# Patient Record
Sex: Female | Born: 1950
Health system: Southern US, Community
[De-identification: ages and names within clinical notes are randomized; demographics above are authoritative.]

## PROBLEM LIST (undated history)

## (undated) DIAGNOSIS — Z5189 Encounter for other specified aftercare: Secondary | ICD-10-CM

## (undated) DIAGNOSIS — R112 Nausea with vomiting, unspecified: Secondary | ICD-10-CM

## (undated) DIAGNOSIS — T7840XA Allergy, unspecified, initial encounter: Secondary | ICD-10-CM

## (undated) DIAGNOSIS — T8859XA Other complications of anesthesia, initial encounter: Secondary | ICD-10-CM

## (undated) DIAGNOSIS — Z86010 Personal history of colonic polyps: Secondary | ICD-10-CM

## (undated) DIAGNOSIS — T4145XA Adverse effect of unspecified anesthetic, initial encounter: Secondary | ICD-10-CM

## (undated) DIAGNOSIS — J302 Other seasonal allergic rhinitis: Secondary | ICD-10-CM

## (undated) DIAGNOSIS — M1990 Unspecified osteoarthritis, unspecified site: Secondary | ICD-10-CM

## (undated) DIAGNOSIS — Z9889 Other specified postprocedural states: Secondary | ICD-10-CM

## (undated) HISTORY — DX: Other seasonal allergic rhinitis: J30.2

## (undated) HISTORY — PX: POLYPECTOMY: SHX149

## (undated) HISTORY — PX: COLONOSCOPY: SHX174

## (undated) HISTORY — DX: Allergy, unspecified, initial encounter: T78.40XA

## (undated) HISTORY — DX: Encounter for other specified aftercare: Z51.89

## (undated) HISTORY — PX: REDUCTION MAMMAPLASTY: SUR839

## (undated) HISTORY — DX: Personal history of colonic polyps: Z86.010

---

## 1983-07-20 HISTORY — PX: SPINAL FUSION: SHX223

## 1996-07-19 HISTORY — PX: ABDOMINAL HYSTERECTOMY: SHX81

## 1997-11-15 ENCOUNTER — Other Ambulatory Visit: Admission: RE | Admit: 1997-11-15 | Discharge: 1997-11-15 | Payer: Self-pay | Admitting: Obstetrics and Gynecology

## 1998-11-21 ENCOUNTER — Other Ambulatory Visit: Admission: RE | Admit: 1998-11-21 | Discharge: 1998-11-21 | Payer: Self-pay | Admitting: Obstetrics and Gynecology

## 2000-01-04 ENCOUNTER — Other Ambulatory Visit: Admission: RE | Admit: 2000-01-04 | Discharge: 2000-01-04 | Payer: Self-pay | Admitting: Obstetrics and Gynecology

## 2000-12-09 ENCOUNTER — Encounter: Payer: Self-pay | Admitting: Obstetrics and Gynecology

## 2000-12-09 ENCOUNTER — Encounter: Admission: RE | Admit: 2000-12-09 | Discharge: 2000-12-09 | Payer: Self-pay | Admitting: Obstetrics and Gynecology

## 2001-01-23 ENCOUNTER — Other Ambulatory Visit: Admission: RE | Admit: 2001-01-23 | Discharge: 2001-01-23 | Payer: Self-pay | Admitting: Obstetrics and Gynecology

## 2001-12-12 ENCOUNTER — Encounter: Payer: Self-pay | Admitting: Obstetrics and Gynecology

## 2001-12-12 ENCOUNTER — Encounter: Admission: RE | Admit: 2001-12-12 | Discharge: 2001-12-12 | Payer: Self-pay | Admitting: Obstetrics and Gynecology

## 2003-01-30 ENCOUNTER — Encounter: Payer: Self-pay | Admitting: Obstetrics and Gynecology

## 2003-01-30 ENCOUNTER — Encounter: Admission: RE | Admit: 2003-01-30 | Discharge: 2003-01-30 | Payer: Self-pay | Admitting: Obstetrics and Gynecology

## 2003-04-19 ENCOUNTER — Other Ambulatory Visit: Admission: RE | Admit: 2003-04-19 | Discharge: 2003-04-19 | Payer: Self-pay | Admitting: Obstetrics and Gynecology

## 2003-07-20 HISTORY — PX: BREAST REDUCTION SURGERY: SHX8

## 2003-12-19 ENCOUNTER — Encounter: Admission: RE | Admit: 2003-12-19 | Discharge: 2003-12-19 | Payer: Self-pay | Admitting: Obstetrics and Gynecology

## 2005-02-02 ENCOUNTER — Encounter: Admission: RE | Admit: 2005-02-02 | Discharge: 2005-02-02 | Payer: Self-pay | Admitting: Obstetrics and Gynecology

## 2005-03-11 ENCOUNTER — Other Ambulatory Visit: Admission: RE | Admit: 2005-03-11 | Discharge: 2005-03-11 | Payer: Self-pay | Admitting: Obstetrics and Gynecology

## 2006-03-08 ENCOUNTER — Encounter: Admission: RE | Admit: 2006-03-08 | Discharge: 2006-03-08 | Payer: Self-pay | Admitting: Internal Medicine

## 2006-05-30 ENCOUNTER — Encounter: Admission: RE | Admit: 2006-05-30 | Discharge: 2006-05-30 | Payer: Self-pay | Admitting: Internal Medicine

## 2007-04-14 ENCOUNTER — Encounter: Admission: RE | Admit: 2007-04-14 | Discharge: 2007-04-14 | Payer: Self-pay | Admitting: Internal Medicine

## 2008-05-31 ENCOUNTER — Encounter: Admission: RE | Admit: 2008-05-31 | Discharge: 2008-05-31 | Payer: Self-pay | Admitting: Internal Medicine

## 2009-06-04 ENCOUNTER — Encounter: Admission: RE | Admit: 2009-06-04 | Discharge: 2009-06-04 | Payer: Self-pay | Admitting: Internal Medicine

## 2010-06-23 ENCOUNTER — Encounter: Admission: RE | Admit: 2010-06-23 | Discharge: 2010-06-23 | Payer: Self-pay | Admitting: Internal Medicine

## 2010-08-08 ENCOUNTER — Encounter: Payer: Self-pay | Admitting: Internal Medicine

## 2011-10-05 ENCOUNTER — Other Ambulatory Visit: Payer: Self-pay | Admitting: Internal Medicine

## 2011-10-05 DIAGNOSIS — Z1231 Encounter for screening mammogram for malignant neoplasm of breast: Secondary | ICD-10-CM

## 2011-10-26 ENCOUNTER — Ambulatory Visit: Payer: Self-pay

## 2011-11-05 ENCOUNTER — Ambulatory Visit
Admission: RE | Admit: 2011-11-05 | Discharge: 2011-11-05 | Disposition: A | Discharge status: Home / Self Care | Payer: BC Managed Care – PPO | Source: Ambulatory Visit | Attending: Internal Medicine | Admitting: Internal Medicine

## 2011-11-05 DIAGNOSIS — Z1231 Encounter for screening mammogram for malignant neoplasm of breast: Secondary | ICD-10-CM

## 2012-07-19 DIAGNOSIS — Z860100 Personal history of colon polyps, unspecified: Secondary | ICD-10-CM

## 2012-07-19 HISTORY — DX: Personal history of colon polyps, unspecified: Z86.0100

## 2012-07-19 HISTORY — DX: Personal history of colonic polyps: Z86.010

## 2012-11-30 ENCOUNTER — Other Ambulatory Visit: Payer: Self-pay

## 2012-11-30 DIAGNOSIS — Z1231 Encounter for screening mammogram for malignant neoplasm of breast: Secondary | ICD-10-CM

## 2012-12-20 ENCOUNTER — Ambulatory Visit
Admission: RE | Admit: 2012-12-20 | Discharge: 2012-12-20 | Disposition: A | Discharge status: Home / Self Care | Payer: BC Managed Care – PPO | Source: Ambulatory Visit

## 2012-12-20 DIAGNOSIS — Z1231 Encounter for screening mammogram for malignant neoplasm of breast: Secondary | ICD-10-CM

## 2013-02-20 ENCOUNTER — Encounter: Payer: Self-pay | Admitting: Internal Medicine

## 2013-05-30 ENCOUNTER — Ambulatory Visit (AMBULATORY_SURGERY_CENTER): Payer: Self-pay | Admitting: *Deleted

## 2013-05-30 VITALS — Ht 69.0 in | Wt 138.0 lb

## 2013-05-30 DIAGNOSIS — Z1211 Encounter for screening for malignant neoplasm of colon: Secondary | ICD-10-CM

## 2013-05-30 MED ORDER — MOVIPREP 100 G PO SOLR: ORAL | Status: DC

## 2013-05-30 NOTE — Progress Notes (Signed)
No allergies to eggs or soy. No problems with anesthesia.  

## 2013-05-31 ENCOUNTER — Encounter: Payer: Self-pay | Admitting: Internal Medicine

## 2013-06-13 ENCOUNTER — Encounter: Payer: Self-pay | Admitting: Internal Medicine

## 2013-06-13 ENCOUNTER — Ambulatory Visit (AMBULATORY_SURGERY_CENTER): Payer: BC Managed Care – PPO | Admitting: Internal Medicine

## 2013-06-13 VITALS — BP 117/62 | HR 53 | Temp 96.6°F | Resp 20 | Ht 69.0 in | Wt 138.0 lb

## 2013-06-13 DIAGNOSIS — Z1211 Encounter for screening for malignant neoplasm of colon: Secondary | ICD-10-CM

## 2013-06-13 DIAGNOSIS — D129 Benign neoplasm of anus and anal canal: Secondary | ICD-10-CM

## 2013-06-13 DIAGNOSIS — D128 Benign neoplasm of rectum: Secondary | ICD-10-CM

## 2013-06-13 DIAGNOSIS — D126 Benign neoplasm of colon, unspecified: Secondary | ICD-10-CM

## 2013-06-13 MED ORDER — SODIUM CHLORIDE 0.9 % IV SOLN: 500.0000 mL | INTRAVENOUS | Status: DC

## 2013-06-13 NOTE — Progress Notes (Signed)
Patient did not experience any of the following events: a burn prior to discharge; a fall within the facility; wrong site/side/patient/procedure/implant event; or a hospital transfer or hospital admission upon discharge from the facility. (G8907) Patient did not have preoperative order for IV antibiotic SSI prophylaxis. (G8918)  

## 2013-06-13 NOTE — Patient Instructions (Addendum)

## 2013-06-13 NOTE — Progress Notes (Signed)
Report to pacu rn, vss, bbs=clear 

## 2013-06-13 NOTE — Progress Notes (Signed)
Called to room to assist during endoscopic procedure.  Patient ID and intended procedure confirmed with present staff. Received instructions for my participation in the procedure from the performing physician.  

## 2013-06-13 NOTE — Op Note (Signed)
[facility] [address] [place], [address]   COLONOSCOPY PROCEDURE REPORT  PATIENT: Kaitlyn, Spencer  MR#: [id number] BIRTHDATE: January 18, 1951 , 62  yrs. old GENDER: Female ENDOSCOPIST: Hart Carwin, MD REFERRED XB:[id number] Avva, M.D. PROCEDURE DATE:  06/13/2013 PROCEDURE:   Colonoscopy with snare polypectomy and Colonoscopy with cold biopsy polypectomy First Screening Colonoscopy - Avg.  risk and is 50 yrs.  old or older - No.  Prior Negative Screening - Now for repeat screening. 10 or more years since last screening  History of Adenoma - Now for follow-up colonoscopy & has been > or = to 3 yrs.  N/A  Polyps Removed Today? Yes. ASA CLASS:   Class I INDICATIONS:Average risk patient for colon cancer. MEDICATIONS: MAC sedation, administered by CRNA and Propofol (Diprivan) 320 mg IV  DESCRIPTION OF PROCEDURE:   After the risks benefits and alternatives of the procedure were thoroughly explained, informed consent was obtained.  A digital rectal exam revealed no abnormalities of the rectum.   The LB PFC-H190 [id number]  endoscope was introduced through the anus and advanced to the cecum, which was identified by both the appendix and ileocecal valve. No adverse events experienced.   The quality of the prep was excellent, using MoviPrep  The instrument was then slowly withdrawn as the colon was fully examined.      COLON FINDINGS: Multiple smooth sessile polyps ranging between 3-22mm in size were found in the sigmoid colon and rectum.  A polypectomy was performed with cold forceps and using snare cautery.  The resection was complete and the polyp tissue was completely retrieved.   Mild diverticulosis was noted in the sigmoid colon. Retroflexed views revealed no abnormalities. The time to cecum=3 minutes 45 seconds.  Withdrawal time=15 minutes 55 seconds.  The scope was withdrawn and the procedure completed. COMPLICATIONS: There were no complications.  ENDOSCOPIC  IMPRESSION: 1.   Multiple sessile polyps ranging between 3-57mm in size were found in the sigmoid colon and rectum; polypectomy was performed with cold forceps and using snare cautery 2.   Mild diverticulosis was noted in the sigmoid colon  RECOMMENDATIONS: 1.  Await pathology results 2.  High fiber diet 3.   recall colonoscopy pending biopsies   eSigned:  Hart Carwin, MD 06/13/2013 8:37 AM   cc:   PATIENT NAME:  Kaitlyn, Spencer MR#: [id number]

## 2013-06-18 ENCOUNTER — Telehealth: Payer: Self-pay | Admitting: *Deleted

## 2013-06-18 NOTE — Telephone Encounter (Signed)
  Follow up Call-  Call back number 06/13/2013  Post procedure Call Back phone  # [phone]  Permission to leave phone message Yes     Patient questions:  Do you have a fever, pain , or abdominal swelling? no Pain Score  0 *  Have you tolerated food without any problems? yes  Have you been able to return to your normal activities? yes  Do you have any questions about your discharge instructions: Diet   no Medications  no Follow up visit  no  Do you have questions or concerns about your Care? no  Actions: * If pain score is 4 or above: No action needed, pain <4.

## 2013-06-19 ENCOUNTER — Encounter: Payer: Self-pay | Admitting: Internal Medicine

## 2013-07-25 ENCOUNTER — Encounter: Payer: Self-pay | Admitting: Internal Medicine

## 2014-06-18 ENCOUNTER — Ambulatory Visit
Admission: RE | Admit: 2014-06-18 | Discharge: 2014-06-18 | Disposition: A | Discharge status: Home / Self Care | Payer: BC Managed Care – PPO | Source: Ambulatory Visit | Attending: Internal Medicine | Admitting: Internal Medicine

## 2014-06-18 ENCOUNTER — Other Ambulatory Visit: Payer: Self-pay | Admitting: Internal Medicine

## 2014-06-18 DIAGNOSIS — N6325 Unspecified lump in the left breast, overlapping quadrants: Secondary | ICD-10-CM

## 2014-06-18 DIAGNOSIS — N6320 Unspecified lump in the left breast, unspecified quadrant: Principal | ICD-10-CM

## 2014-09-24 ENCOUNTER — Encounter: Payer: Self-pay | Admitting: *Deleted

## 2014-09-25 ENCOUNTER — Ambulatory Visit (INDEPENDENT_AMBULATORY_CARE_PROVIDER_SITE_OTHER): Payer: 59 | Admitting: *Deleted

## 2014-09-25 DIAGNOSIS — I8393 Asymptomatic varicose veins of bilateral lower extremities: Secondary | ICD-10-CM

## 2014-09-25 DIAGNOSIS — I781 Nevus, non-neoplastic: Secondary | ICD-10-CM

## 2014-09-25 NOTE — Progress Notes (Signed)
   Cutaneous Laser:pulsed mode  810j/cm2 400 ms delay  13 ms Duration 0.5 spot  Total pulses: 1489 Total energy 2.359  Total time::19  Photos: No.  Compression stockings applied: No.NA  Tiny red spiders (capillaries) on her legs. Too small for sclero. Great response to CL. Tol well. Anticipate good results. Follow prn.

## 2014-11-29 ENCOUNTER — Ambulatory Visit: Payer: 59 | Admitting: Podiatry

## 2014-12-25 ENCOUNTER — Ambulatory Visit: Payer: 59 | Admitting: Podiatry

## 2015-02-17 ENCOUNTER — Ambulatory Visit (INDEPENDENT_AMBULATORY_CARE_PROVIDER_SITE_OTHER): Payer: No Typology Code available for payment source | Admitting: Podiatry

## 2015-02-17 ENCOUNTER — Ambulatory Visit (INDEPENDENT_AMBULATORY_CARE_PROVIDER_SITE_OTHER): Payer: No Typology Code available for payment source

## 2015-02-17 ENCOUNTER — Encounter: Payer: Self-pay | Admitting: Podiatry

## 2015-02-17 VITALS — BP 99/86 | HR 69 | Resp 12

## 2015-02-17 DIAGNOSIS — M792 Neuralgia and neuritis, unspecified: Secondary | ICD-10-CM | POA: Diagnosis not present

## 2015-02-17 DIAGNOSIS — D3610 Benign neoplasm of peripheral nerves and autonomic nervous system, unspecified: Secondary | ICD-10-CM

## 2015-02-17 DIAGNOSIS — R52 Pain, unspecified: Secondary | ICD-10-CM

## 2015-02-17 NOTE — Progress Notes (Signed)
   Subjective:    Patient ID: Kaitlyn Spencer, female    DOB: January 11, 1951, 64 y.o.   MRN: [id number]  HPI  64 year old female presents the office with concerns of right second, third, fourth toe numbness which has been ongoing for approximately 6 months. She states that she only has numbness after working out for proximal 45 min to 1 hour before the sensations start to occur. She has no pain at rest or any tingling or numbness at rest or with regular activity. She states that she only has assumed tried bike or wearing a heavy hiking shoe. She denies any history of injury or trauma. Denies any swelling or redness. She's had no prior treatment. No other complaints at this time.   Review of Systems  Neurological: Positive for numbness.  All other systems reviewed and are negative.      Objective:   Physical Exam \\AAO  x3, NAD DP/PT pulses palpable bilaterally, CRT less than 3 seconds Protective sensation intact with Simms Weinstein monofilament, vibratory sensation intact, Achilles tendon reflex intact There is mild to palpation along the second interspace and the right foot. No palpable neuromas identified and there is no pain with medial to lateral compression of the metatarsals. No areas of pinpoint tenderness to bilateral lower extremities. No other areas of tenderness to bilateral lower extremities. MMT 5/5, ROM WNL. Mild hammertoe contractures. Upon weightbearing there is slight splaying of the second and third toes bilaterally. No open lesions or pre-ulcerative lesions.  No overlying edema, erythema, increase in warmth to bilateral lower extremities.  No pain with calf compression, swelling, warmth, erythema bilaterally.      Assessment & Plan:  64 year old female right foot toe numbness after activity, possible neuroma versus biomechanical in nature -X-rays were obtained and reviewed with the patient.  -Treatment options discussed including all alternatives, risks, and  complications -Discussed likely etiology of her symptoms. -Discussed orthotics and offloading. I dispensed her neuroma pad as well as metatarsal pads to try and her shoes. At next appointment we'll see how she is doing and likely make this into a more custom orthotic if this helps.  -Follow-up 4-6 weeks or sooner if any problems arise. In the meantime, encouraged to call the office with any questions, concerns, change in symptoms.   Celesta Gentile, DPM

## 2015-03-31 ENCOUNTER — Ambulatory Visit: Payer: Self-pay | Admitting: Podiatry

## 2015-06-06 ENCOUNTER — Other Ambulatory Visit: Payer: Self-pay

## 2015-06-06 DIAGNOSIS — Z1231 Encounter for screening mammogram for malignant neoplasm of breast: Secondary | ICD-10-CM

## 2015-06-09 ENCOUNTER — Encounter: Payer: Self-pay | Admitting: *Deleted

## 2015-06-11 ENCOUNTER — Ambulatory Visit (INDEPENDENT_AMBULATORY_CARE_PROVIDER_SITE_OTHER): Payer: No Typology Code available for payment source | Admitting: *Deleted

## 2015-06-11 DIAGNOSIS — I781 Nevus, non-neoplastic: Secondary | ICD-10-CM

## 2015-06-11 DIAGNOSIS — I8393 Asymptomatic varicose veins of bilateral lower extremities: Secondary | ICD-10-CM

## 2015-06-11 NOTE — Progress Notes (Signed)
Pt very pleased with the reults from her first treatment. Cleaned up any capillaries I saw. Tol well. Follow this nice lady prn.  Cutaneous Laser:pulsed mode  810j/cm2 400 ms delay  13 ms Duration 0.5 spot  Total pulses: 984 Total energy 1.561.  Total time::06  Photos: No.  Compression stockings applied: No.

## 2015-06-16 ENCOUNTER — Encounter: Payer: Self-pay | Admitting: *Deleted

## 2015-07-08 ENCOUNTER — Ambulatory Visit
Admission: RE | Admit: 2015-07-08 | Discharge: 2015-07-08 | Disposition: A | Discharge status: Home / Self Care | Payer: No Typology Code available for payment source | Source: Ambulatory Visit

## 2015-07-08 DIAGNOSIS — Z1231 Encounter for screening mammogram for malignant neoplasm of breast: Secondary | ICD-10-CM

## 2016-04-16 ENCOUNTER — Other Ambulatory Visit: Payer: Self-pay | Admitting: Otolaryngology

## 2016-04-16 DIAGNOSIS — J309 Allergic rhinitis, unspecified: Secondary | ICD-10-CM

## 2016-05-04 ENCOUNTER — Ambulatory Visit
Admission: RE | Admit: 2016-05-04 | Discharge: 2016-05-04 | Disposition: A | Discharge status: Home / Self Care | Payer: BLUE CROSS/BLUE SHIELD | Source: Ambulatory Visit | Attending: Otolaryngology | Admitting: Otolaryngology

## 2016-05-04 DIAGNOSIS — J309 Allergic rhinitis, unspecified: Secondary | ICD-10-CM

## 2016-05-04 MED ORDER — IOPAMIDOL (ISOVUE-300) INJECTION 61%
75.0000 mL | Freq: Once | INTRAVENOUS | Status: AC | PRN
  Administered 2016-05-04: 75 mL via INTRAVENOUS

## 2016-06-03 ENCOUNTER — Encounter (INDEPENDENT_AMBULATORY_CARE_PROVIDER_SITE_OTHER): Payer: Self-pay

## 2016-06-03 ENCOUNTER — Encounter: Payer: Self-pay | Admitting: Allergy & Immunology

## 2016-06-03 ENCOUNTER — Ambulatory Visit (INDEPENDENT_AMBULATORY_CARE_PROVIDER_SITE_OTHER): Payer: BLUE CROSS/BLUE SHIELD | Admitting: Allergy & Immunology

## 2016-06-03 VITALS — BP 122/70 | HR 63 | Temp 97.4°F | Ht 67.0 in | Wt 137.6 lb

## 2016-06-03 DIAGNOSIS — J329 Chronic sinusitis, unspecified: Secondary | ICD-10-CM | POA: Diagnosis not present

## 2016-06-03 DIAGNOSIS — J3089 Other allergic rhinitis: Secondary | ICD-10-CM

## 2016-06-03 NOTE — Progress Notes (Signed)
NEW PATIENT  Date of Service/Encounter:  06/03/16   Assessment:   Chronic nonseasonal allergic rhinitis due to fungal spores - grasses, ragweed, pigweed, maple, molds, dog, and dust mite  Recurrent sinusitis    Plan/Recommendations:   1. Recurrent sinusitis with chronic allergic rhinitis - Testing showed: grasses, ragweed, pigweed, maple, molds, dog, and dust mite - Continue with your extensive nasal regimen.  - Stop Flonase and start Dymista 2 sprays 1-2 times daily.  - Check with your insurance company and call us with a confirmation that you definitely want to start allergy shots. - We will get the paperwork ready to discuss the process with the outside clinic near your home. - She would like to get her shots at a local clinic [facility] located at [address], [place] [address]; phone number [phone]).  - Consent signed today.   2. Return in about 6 months (around 12/01/2016).   Subjective:   Kaitlyn Spencer is a 65 y.o. female presenting today for evaluation of  Chief Complaint  Patient presents with  . New Evaluation    allergy testing.   Kaitlyn Spencer has a history of the following: Patient Active Problem List   Diagnosis Date Noted  . Chronic nonseasonal allergic rhinitis due to fungal spores 06/03/2016    History obtained from: chart review and patient.  Kaitlyn Spencer was referred by Kaitlyn Ringer, MD.     Kaitlyn Spencer is a 65 y.o. female presenting for an allergy evaluation. She has a long-standing history of allergic rhinitis. She was tested nearly 2 decades ago. She worked with Dr. Smitty Pluck 10-15 years ago and had allergy shots at that time. She was on them for probably 1-3 years, but felt that they lost her effect over time. 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 She reports that her sinus infections have become more severe and more frequent. She estimates that she has 3 sinus infections per year  that require both antibiotics and redness 7 for complete clearance. Her symptoms start with nasal congestion and then gradually worsened to include headaches. She has a very extensive nasal regimen including saline rinses with a Nasal Douche. She does this twice daily. She also uses Flonase 2 sprays per nostril twice daily and Claritin 10 mg daily.     [name] sinus symptoms occur throughout the year. Traveling seems to make it worse, otherwise there are no exacerbating factors. She does note worsening symptoms in the fall when she is picking apples. She has no history of asthma. She does have a history of an amoxicillin allergy resulting in hives. At the time that this was diagnosed, she also had "oils or nails and toenails as well as the side of her tongue. She does get stomach pain from aspirin. Otherwise, there is no history of other atopic diseases, including asthma, food allergies, stinging insect allergies, or urticaria. There is no significant infectious history. Vaccinations are up to date.    Past Medical History: Patient Active Problem List   Diagnosis Date Noted  . Chronic nonseasonal allergic rhinitis due to fungal spores 06/03/2016    Medication List:    Medication List       Accurate as of 06/03/16  3:57 PM. Always use your most recent med list.          CALCIUM 600 600 MG Tabs tablet Generic drug:  calcium carbonate Take 600 mg by mouth.   estradiol  0.5 MG tablet Commonly known as:  ESTRACE Take 0.5 mg by mouth daily.   loratadine 10 MG tablet Commonly known as:  CLARITIN Take 10 mg by mouth daily.   Multiple Vitamins tablet Take 1 tablet by mouth.   VAGIFEM 10 MCG Tabs vaginal tablet Generic drug:  Estradiol   VITAMIN D-1000 MAX ST 1000 units tablet Generic drug:  Cholecalciferol Take 1,000 Units by mouth.       Birth History: non-contributory. Born at term without complications.   Developmental History: Kaitlyn Spencer has met all milestones on time. She  has required no speech therapy, occupational therapy, or physical therapy.   Past Surgical History: Past Surgical History:  Procedure Laterality Date  . ABDOMINAL HYSTERECTOMY  1998  . SPINAL FUSION  1985     Family History: Family History  Problem Relation Age of Onset  . Allergic rhinitis Father   . Colon cancer Neg Hx   . Asthma Neg Hx   . Atopy Neg Hx   . Eczema Neg Hx   . Immunodeficiency Neg Hx   . Urticaria Neg Hx      Social History: Kaitlyn Spencer lives at home with her husband. Her husband actually works in [employer] and lives in an apartment in [place] 3 days per week. This is 75 marriage for each of them. Her husband does have some adult grown children. She lives in a house that is 59 years old. There is hardwood in the living areas and carpeting in the bedrooms. They have electric heating and central cooling. There are cats inside the home. They have dust mite covers on the pillows, but not the bed. There is no tobacco smoke exposure. 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 She does work very hard and or tears. They have 250 acres. Fall seems to be about time for her as she is picking apples at that time and is exposed to multiple pollens and allergens.   Review of Systems: a 14-point review of systems is pertinent for what is mentioned in HPI.  Otherwise, all other systems were negative. Constitutional: negative other than that listed in the HPI Eyes: negative other than that listed in the HPI Ears, nose, mouth, throat, and face: negative other than that listed in the HPI Respiratory: negative other than that listed in the HPI Cardiovascular: negative other than that listed in the HPI Gastrointestinal: negative other than that listed in the HPI Genitourinary: negative other than that listed in the HPI Integument: negative other than that listed in the HPI Hematologic: negative other than that  listed in the HPI Musculoskeletal: negative other than that listed in the HPI Neurological: negative other than that listed in the HPI Allergy/Immunologic: negative other than that listed in the HPI    Objective:   Blood pressure 122/70, pulse 63, temperature 97.4 F (36.3 C), temperature source Oral, height 5\' 7"  (1.702 m), weight 137 lb 9.6 oz (62.4 kg), SpO2 98 %. Body mass index is 21.55 kg/m.   Physical Exam:  General: Alert, interactive, in no acute distress. Pleasant and talkative female. HEENT: TMs pearly gray, turbinates edematous and pale with clear discharge, post-pharynx erythematous. Cobblestoning present in the posterior oropharynx. Neck: Supple without thyromegaly. Adenopathy: no enlarged lymph nodes appreciated in the anterior cervical, occipital, axillary, epitrochlear, inguinal, or popliteal regions Lungs: Clear to auscultation without wheezing, rhonchi or rales. No increased work of breathing. CV: Physiologic splitting of S1/S2, no murmurs. Capillary  refill <2 seconds.  Abdomen: Nondistended, nontender. No guarding or rebound tenderness. Bowel sounds faint and present in all fields  Skin: Warm and dry, without lesions or rashes. Extremities:  No clubbing, cyanosis or edema. Neuro:   Grossly intact. Responsive to questions.  Diagnostic studies:   Allergy Studies:   Indoor/Outdoor Percutaneous Adult Environmental Panel: Positive to [place], perennial rye grass, ragweed, rough pigweed, and maple with adequate controls  Indoor/Outdoor Selected Intradermal Environmental Panel: Positive to mold mixes 2, mold mix 4, dog, and dust mite with adequate controls      Salvatore Marvel, MD [facility] [place] [facility] [place]

## 2016-06-03 NOTE — Patient Instructions (Addendum)
1. Chronic sinusitis, unspecified location - Testing showed: grasses, ragweed, pigweed, maple, molds, dog, and dust mite - Continue with your extensive nasal regimen.  - Stop Flonase and start Dymista 2 sprays 1-2 times daily.  - Check with your insurance company and call us with a confirmation that you definitely want to start allergy shots. - We will get the paperwork ready to discuss the process with the outside clinic near your home.  2. Return in about 6 months (around 12/01/2016).  Please inform us of any Emergency Department visits, hospitalizations, or changes in symptoms. Call us before going to the ED for breathing or allergy symptoms since we might be able to fit you in for a sick visit. Feel free to contact us anytime with any questions, problems, or concerns.  It was a pleasure to meet you today!   Websites that have reliable patient information: 1. American Academy of Asthma, Allergy, and Immunology: www.aaaai.org 2. Food Allergy Research and Education (FARE): foodallergy.org 3. Mothers of Asthmatics: http://www.asthmacommunitynetwork.org 4. American College of Allergy, Asthma, and Immunology: www.acaai.org

## 2016-07-05 ENCOUNTER — Other Ambulatory Visit: Payer: Self-pay | Admitting: Internal Medicine

## 2016-07-05 DIAGNOSIS — Z1231 Encounter for screening mammogram for malignant neoplasm of breast: Secondary | ICD-10-CM

## 2016-07-21 NOTE — Progress Notes (Signed)
Vials made.  JM

## 2016-07-22 DIAGNOSIS — J3089 Other allergic rhinitis: Secondary | ICD-10-CM | POA: Diagnosis not present

## 2016-07-23 DIAGNOSIS — J301 Allergic rhinitis due to pollen: Secondary | ICD-10-CM | POA: Diagnosis not present

## 2016-07-28 ENCOUNTER — Ambulatory Visit
Admission: RE | Admit: 2016-07-28 | Discharge: 2016-07-28 | Disposition: A | Discharge status: Home / Self Care | Payer: BLUE CROSS/BLUE SHIELD | Source: Ambulatory Visit | Attending: Internal Medicine | Admitting: Internal Medicine

## 2016-07-28 ENCOUNTER — Ambulatory Visit: Payer: Self-pay

## 2016-07-28 ENCOUNTER — Ambulatory Visit (INDEPENDENT_AMBULATORY_CARE_PROVIDER_SITE_OTHER): Payer: BLUE CROSS/BLUE SHIELD

## 2016-07-28 ENCOUNTER — Telehealth: Payer: Self-pay

## 2016-07-28 DIAGNOSIS — J3089 Other allergic rhinitis: Secondary | ICD-10-CM

## 2016-07-28 DIAGNOSIS — Z1231 Encounter for screening mammogram for malignant neoplasm of breast: Secondary | ICD-10-CM

## 2016-07-28 NOTE — Telephone Encounter (Addendum)
[facility] clld back- advsd she spoke to the pt. She will be scheduling appt next week with Butch Penny, NP to re-establish with their office as well as receive her injections there. I Skeet Simmer once I receive the signed transfer form I will forward the pt.'s vials. Lovena Le stated she understood .     Received call from [place] will have to re-establish with the clinic before any injections can be done. Per Lovena Le, pt has not been seen in their office since 2013. Lovena Le will contact pt to schedule appt. Faxed 06/03/16 office notes - will wait for callback regarding appt and signed transfer letter from their office.

## 2016-10-01 ENCOUNTER — Ambulatory Visit (INDEPENDENT_AMBULATORY_CARE_PROVIDER_SITE_OTHER): Payer: BLUE CROSS/BLUE SHIELD | Admitting: *Deleted

## 2016-10-01 DIAGNOSIS — J309 Allergic rhinitis, unspecified: Secondary | ICD-10-CM

## 2016-10-01 NOTE — Progress Notes (Signed)
Immunotherapy   Patient Details  Name: Kaitlyn Spencer MRN: [id number] Date of Birth: 21-Jun-1951  10/01/2016  [name] here to pick up  Gold vial (Mold-Mite & Pollen-Dog). Following schedule: B  Frequency:2 times per week Epi-Pen:Epi-Pen Available  Consent signed and patient instructions given. No problems after 30 minutes in the office.    Constance Holster 10/01/2016, 9:32 AM

## 2016-11-30 ENCOUNTER — Encounter: Payer: Self-pay | Admitting: *Deleted

## 2016-11-30 NOTE — Progress Notes (Signed)
Immunotherapy   Patient Details  Name: Kaitlyn Spencer MRN: [id number] Date of Birth: 08-30-1950  11/30/2016  [name] mailed Green vials 1:1000 for  Pollen-Dog & Mold-Mite. Following schedule: B  Frequency:2 times per week Epi-Pen:Epi-Pen Available  Consent signed and patient instructions given. Patient receives [facility] at [address], [place] [address].    Constance Holster 11/30/2016, 2:29 PM

## 2016-12-21 ENCOUNTER — Ambulatory Visit: Payer: Self-pay

## 2016-12-21 ENCOUNTER — Encounter: Payer: Self-pay | Admitting: *Deleted

## 2016-12-21 NOTE — Progress Notes (Signed)
Immunotherapy   Patient Details  Name: Kaitlyn Spencer MRN: [id number] Date of Birth: [birth year]  12/21/2016  [name] mailed Red vials 1:100 for  Pollen-Dog & Mold-Mite. Following schedule: B  Frequency:1 times per week Epi-Pen:Epi-Pen Available  Consent signed and patient instructions given. Patient receives [facility] at [address], [place] [address].     Constance Holster 12/21/2016, 5:35 PM

## 2017-03-11 ENCOUNTER — Other Ambulatory Visit: Payer: Self-pay | Admitting: Internal Medicine

## 2017-03-11 DIAGNOSIS — L821 Other seborrheic keratosis: Secondary | ICD-10-CM | POA: Diagnosis not present

## 2017-03-11 DIAGNOSIS — L720 Epidermal cyst: Secondary | ICD-10-CM | POA: Diagnosis not present

## 2017-03-11 DIAGNOSIS — R197 Diarrhea, unspecified: Secondary | ICD-10-CM | POA: Diagnosis not present

## 2017-03-11 DIAGNOSIS — Z6820 Body mass index (BMI) 20.0-20.9, adult: Secondary | ICD-10-CM | POA: Diagnosis not present

## 2017-03-11 DIAGNOSIS — J301 Allergic rhinitis due to pollen: Secondary | ICD-10-CM | POA: Diagnosis not present

## 2017-03-11 DIAGNOSIS — K2900 Acute gastritis without bleeding: Secondary | ICD-10-CM

## 2017-03-11 DIAGNOSIS — D225 Melanocytic nevi of trunk: Secondary | ICD-10-CM | POA: Diagnosis not present

## 2017-03-11 DIAGNOSIS — D1801 Hemangioma of skin and subcutaneous tissue: Secondary | ICD-10-CM | POA: Diagnosis not present

## 2017-03-15 ENCOUNTER — Encounter: Payer: Self-pay | Admitting: *Deleted

## 2017-03-18 ENCOUNTER — Ambulatory Visit
Admission: RE | Admit: 2017-03-18 | Discharge: 2017-03-18 | Disposition: A | Discharge status: Home / Self Care | Payer: Medicare Other | Source: Ambulatory Visit | Attending: Internal Medicine | Admitting: Internal Medicine

## 2017-03-18 DIAGNOSIS — K2900 Acute gastritis without bleeding: Secondary | ICD-10-CM

## 2017-03-18 DIAGNOSIS — R1013 Epigastric pain: Secondary | ICD-10-CM | POA: Diagnosis not present

## 2017-03-18 NOTE — Progress Notes (Signed)
Immunotherapy   Patient Details  Name: Kaitlyn Spencer MRN: [id number] Date of Birth: [birth year]  03/15/2017  [name] mailed  Red vials Pollen-Dog & Mold-Mite. Following schedule: B  Frequency:1 time per week Epi-Pen:Epi-Pen Available  Consent signed and patient instructions given. Patient receives injections at [facility] @ [address], [place] [address]   Heather Clark 03/15/2017, 9:41 AM

## 2017-05-12 ENCOUNTER — Encounter (HOSPITAL_COMMUNITY)
Admission: EM | Disposition: A | Discharge status: Home Health | Payer: Self-pay | Source: Home / Self Care | Attending: Orthopedic Surgery

## 2017-05-12 ENCOUNTER — Encounter (HOSPITAL_COMMUNITY): Payer: Self-pay

## 2017-05-12 ENCOUNTER — Inpatient Hospital Stay (HOSPITAL_COMMUNITY): Payer: Medicare Other | Admitting: Certified Registered"

## 2017-05-12 ENCOUNTER — Inpatient Hospital Stay (HOSPITAL_COMMUNITY): Payer: Medicare Other

## 2017-05-12 ENCOUNTER — Inpatient Hospital Stay (HOSPITAL_COMMUNITY)
Admission: EM | Admit: 2017-05-12 | Discharge: 2017-05-13 | DRG: 481 | Disposition: A | Discharge status: Home Health | Payer: Medicare Other | Attending: Orthopedic Surgery | Admitting: Orthopedic Surgery

## 2017-05-12 DIAGNOSIS — D62 Acute posthemorrhagic anemia: Secondary | ICD-10-CM | POA: Diagnosis not present

## 2017-05-12 DIAGNOSIS — Z8262 Family history of osteoporosis: Secondary | ICD-10-CM | POA: Diagnosis not present

## 2017-05-12 DIAGNOSIS — Z880 Allergy status to penicillin: Secondary | ICD-10-CM | POA: Diagnosis not present

## 2017-05-12 DIAGNOSIS — Z01818 Encounter for other preprocedural examination: Secondary | ICD-10-CM

## 2017-05-12 DIAGNOSIS — Z419 Encounter for procedure for purposes other than remedying health state, unspecified: Secondary | ICD-10-CM

## 2017-05-12 DIAGNOSIS — Z820 Family history of epilepsy and other diseases of the nervous system: Secondary | ICD-10-CM | POA: Diagnosis not present

## 2017-05-12 DIAGNOSIS — Z886 Allergy status to analgesic agent status: Secondary | ICD-10-CM

## 2017-05-12 DIAGNOSIS — M898X9 Other specified disorders of bone, unspecified site: Secondary | ICD-10-CM | POA: Diagnosis present

## 2017-05-12 DIAGNOSIS — Z90710 Acquired absence of both cervix and uterus: Secondary | ICD-10-CM

## 2017-05-12 DIAGNOSIS — S7291XA Unspecified fracture of right femur, initial encounter for closed fracture: Secondary | ICD-10-CM | POA: Diagnosis not present

## 2017-05-12 DIAGNOSIS — Z881 Allergy status to other antibiotic agents status: Secondary | ICD-10-CM | POA: Diagnosis not present

## 2017-05-12 DIAGNOSIS — Z86010 Personal history of colonic polyps: Secondary | ICD-10-CM | POA: Diagnosis not present

## 2017-05-12 DIAGNOSIS — Z981 Arthrodesis status: Secondary | ICD-10-CM | POA: Diagnosis not present

## 2017-05-12 DIAGNOSIS — J3089 Other allergic rhinitis: Secondary | ICD-10-CM | POA: Diagnosis not present

## 2017-05-12 DIAGNOSIS — R9431 Abnormal electrocardiogram [ECG] [EKG]: Secondary | ICD-10-CM | POA: Diagnosis not present

## 2017-05-12 DIAGNOSIS — S72001A Fracture of unspecified part of neck of right femur, initial encounter for closed fracture: Secondary | ICD-10-CM | POA: Diagnosis not present

## 2017-05-12 DIAGNOSIS — S72009A Fracture of unspecified part of neck of unspecified femur, initial encounter for closed fracture: Secondary | ICD-10-CM | POA: Diagnosis present

## 2017-05-12 HISTORY — PX: HIP PINNING,CANNULATED: SHX1758

## 2017-05-12 LAB — CBC
HCT: 39.3 % (ref 36.0–46.0)
HCT: 39 % (ref 36.0–46.0)
Hemoglobin: 12.9 g/dL (ref 12.0–15.0)
Hemoglobin: 12.8 g/dL (ref 12.0–15.0)
MCH: 30.1 pg (ref 26.0–34.0)
MCH: 30.3 pg (ref 26.0–34.0)
MCHC: 32.8 g/dL (ref 30.0–36.0)
MCHC: 32.8 g/dL (ref 30.0–36.0)
MCV: 91.6 fL (ref 78.0–100.0)
MCV: 92.4 fL (ref 78.0–100.0)
PLATELETS: 211 10*3/uL (ref 150–400)
PLATELETS: 206 10*3/uL (ref 150–400)
RBC: 4.29 MIL/uL (ref 3.87–5.11)
RBC: 4.22 MIL/uL (ref 3.87–5.11)
RDW: 13.6 % (ref 11.5–15.5)
RDW: 13.2 % (ref 11.5–15.5)
WBC: 7.8 10*3/uL (ref 4.0–10.5)
WBC: 9 10*3/uL (ref 4.0–10.5)

## 2017-05-12 LAB — COMPREHENSIVE METABOLIC PANEL
ALT: 17 U/L (ref 14–54)
AST: 20 U/L (ref 15–41)
Albumin: 3.8 g/dL (ref 3.5–5.0)
Alkaline Phosphatase: 50 U/L (ref 38–126)
Anion gap: 8 (ref 5–15)
BUN: 9 mg/dL (ref 6–20)
CHLORIDE: 107 mmol/L (ref 101–111)
CO2: 23 mmol/L (ref 22–32)
CREATININE: 0.66 mg/dL (ref 0.44–1.00)
Calcium: 9 mg/dL (ref 8.9–10.3)
GFR calc Af Amer: >60 mL/min (ref >60)
GFR calc non Af Amer: >60 mL/min (ref >60)
Glucose, Bld: 101 mg/dL — ABNORMAL HIGH (ref 65–99)
Potassium: 3.7 mmol/L (ref 3.5–5.1)
SODIUM: 138 mmol/L (ref 135–145)
Total Bilirubin: 0.8 mg/dL (ref 0.3–1.2)
Total Protein: 6.2 g/dL — ABNORMAL LOW (ref 6.5–8.1)

## 2017-05-12 LAB — PHOSPHORUS: Phosphorus: 3.8 mg/dL (ref 2.5–4.6)

## 2017-05-12 LAB — MAGNESIUM: MAGNESIUM: 2 mg/dL (ref 1.7–2.4)

## 2017-05-12 LAB — CBC WITH DIFFERENTIAL/PLATELET
Basophils Absolute: 0 10*3/uL (ref 0.0–0.1)
Basophils Relative: 0 %
EOS ABS: 0 10*3/uL (ref 0.0–0.7)
Eosinophils Relative: 0 %
HEMATOCRIT: 41.2 % (ref 36.0–46.0)
HEMOGLOBIN: 13.8 g/dL (ref 12.0–15.0)
LYMPHS ABS: 2 10*3/uL (ref 0.7–4.0)
LYMPHS PCT: 24 %
MCH: 30.8 pg (ref 26.0–34.0)
MCHC: 33.5 g/dL (ref 30.0–36.0)
MCV: 92 fL (ref 78.0–100.0)
Monocytes Absolute: 0.7 10*3/uL (ref 0.1–1.0)
Monocytes Relative: 9 %
NEUTROS ABS: 5.5 10*3/uL (ref 1.7–7.7)
Neutrophils Relative %: 67 %
Platelets: 225 10*3/uL (ref 150–400)
RBC: 4.48 MIL/uL (ref 3.87–5.11)
RDW: 13 % (ref 11.5–15.5)
WBC: 8.3 10*3/uL (ref 4.0–10.5)

## 2017-05-12 LAB — BASIC METABOLIC PANEL
Anion gap: 7 (ref 5–15)
BUN: 9 mg/dL (ref 6–20)
CHLORIDE: 106 mmol/L (ref 101–111)
CO2: 25 mmol/L (ref 22–32)
CREATININE: 0.7 mg/dL (ref 0.44–1.00)
Calcium: 9.1 mg/dL (ref 8.9–10.3)
GFR calc non Af Amer: >60 mL/min (ref >60)
Glucose, Bld: 109 mg/dL — ABNORMAL HIGH (ref 65–99)
POTASSIUM: 3.4 mmol/L — AB (ref 3.5–5.1)
Sodium: 138 mmol/L (ref 135–145)

## 2017-05-12 LAB — CREATININE, SERUM
CREATININE: 0.65 mg/dL (ref 0.44–1.00)
GFR calc Af Amer: >60 mL/min (ref >60)
GFR calc non Af Amer: >60 mL/min (ref >60)

## 2017-05-12 LAB — ABO/RH: ABO/RH(D): O POS

## 2017-05-12 LAB — PREALBUMIN: Prealbumin: 24.8 mg/dL (ref 18–38)

## 2017-05-12 LAB — TYPE AND SCREEN
ABO/RH(D): O POS
ANTIBODY SCREEN: NEGATIVE

## 2017-05-12 LAB — PROTIME-INR
INR: 0.97
Prothrombin Time: 12.8 seconds (ref 11.4–15.2)

## 2017-05-12 LAB — TSH: TSH: 1.42 u[IU]/mL (ref 0.350–4.500)

## 2017-05-12 SURGERY — FIXATION, FEMUR, NECK, PERCUTANEOUS, USING SCREW
Anesthesia: General | Site: Hip | Laterality: Right

## 2017-05-12 MED ORDER — FENTANYL CITRATE (PF) 100 MCG/2ML IJ SOLN
50.0000 ug | INTRAMUSCULAR | Status: DC | PRN
Start: 2017-05-12 — End: 2017-05-12

## 2017-05-12 MED ORDER — SUGAMMADEX SODIUM 200 MG/2ML IV SOLN
INTRAVENOUS | Status: AC
  Filled 2017-05-12: qty 2

## 2017-05-12 MED ORDER — HYDROMORPHONE HCL 1 MG/ML IJ SOLN
0.2500 mg | INTRAMUSCULAR | Status: DC | PRN
  Administered 2017-05-12 (×2): 0.5 mg via INTRAVENOUS

## 2017-05-12 MED ORDER — MIDAZOLAM HCL 5 MG/5ML IJ SOLN
INTRAMUSCULAR | Status: DC | PRN
  Administered 2017-05-12: 2 mg via INTRAVENOUS

## 2017-05-12 MED ORDER — HYDROMORPHONE HCL 1 MG/ML IJ SOLN
INTRAMUSCULAR | Status: AC
  Administered 2017-05-12: 0.5 mg via INTRAVENOUS
  Filled 2017-05-12: qty 1

## 2017-05-12 MED ORDER — DEXAMETHASONE SODIUM PHOSPHATE 10 MG/ML IJ SOLN
INTRAMUSCULAR | Status: AC
  Filled 2017-05-12: qty 1

## 2017-05-12 MED ORDER — LACTATED RINGERS IV SOLN
INTRAVENOUS | Status: DC | PRN
  Administered 2017-05-12 (×2): via INTRAVENOUS

## 2017-05-12 MED ORDER — SCOPOLAMINE 1 MG/3DAYS TD PT72
1.0000 | MEDICATED_PATCH | TRANSDERMAL | Status: DC
  Administered 2017-05-12: 1.5 mg via TRANSDERMAL
  Filled 2017-05-12: qty 1

## 2017-05-12 MED ORDER — CALCIUM CARBONATE 1250 (500 CA) MG PO TABS
1.0000 | ORAL_TABLET | Freq: Every day | ORAL | Status: DC
  Filled 2017-05-12: qty 1

## 2017-05-12 MED ORDER — LIDOCAINE 2% (20 MG/ML) 5 ML SYRINGE
INTRAMUSCULAR | Status: DC | PRN
Start: 2017-05-12 — End: 2017-05-12
  Administered 2017-05-12: 50 mg via INTRAVENOUS

## 2017-05-12 MED ORDER — CEFAZOLIN SODIUM-DEXTROSE 2-4 GM/100ML-% IV SOLN
2.0000 g | Freq: Four times a day (QID) | INTRAVENOUS | Status: AC
  Administered 2017-05-12 – 2017-05-13 (×2): 2 g via INTRAVENOUS
  Filled 2017-05-12 (×2): qty 100

## 2017-05-12 MED ORDER — POTASSIUM CHLORIDE CRYS ER 20 MEQ PO TBCR
40.0000 meq | EXTENDED_RELEASE_TABLET | Freq: Once | ORAL | Status: AC
  Administered 2017-05-12: 40 meq via ORAL
  Filled 2017-05-12: qty 2

## 2017-05-12 MED ORDER — ONDANSETRON HCL 4 MG/2ML IJ SOLN
INTRAMUSCULAR | Status: AC
  Administered 2017-05-12: 4 mg via INTRAVENOUS
  Filled 2017-05-12: qty 2

## 2017-05-12 MED ORDER — SUGAMMADEX SODIUM 200 MG/2ML IV SOLN
INTRAVENOUS | Status: DC | PRN
Start: 2017-05-12 — End: 2017-05-12
  Administered 2017-05-12: 200 mg via INTRAVENOUS

## 2017-05-12 MED ORDER — METOCLOPRAMIDE HCL 5 MG/ML IJ SOLN
5.0000 mg | Freq: Three times a day (TID) | INTRAMUSCULAR | Status: DC | PRN
  Administered 2017-05-12 – 2017-05-13 (×2): 10 mg via INTRAVENOUS
  Filled 2017-05-12: qty 2

## 2017-05-12 MED ORDER — EPHEDRINE 5 MG/ML INJ
INTRAVENOUS | Status: AC
  Filled 2017-05-12: qty 10

## 2017-05-12 MED ORDER — ONDANSETRON HCL 4 MG PO TABS: 4.0000 mg | ORAL_TABLET | Freq: Four times a day (QID) | ORAL | Status: DC | PRN

## 2017-05-12 MED ORDER — FAMOTIDINE IN NACL 20-0.9 MG/50ML-% IV SOLN
INTRAVENOUS | Status: AC
  Filled 2017-05-12: qty 50

## 2017-05-12 MED ORDER — MENTHOL 3 MG MT LOZG: 1.0000 | LOZENGE | OROMUCOSAL | Status: DC | PRN

## 2017-05-12 MED ORDER — 0.9 % SODIUM CHLORIDE (POUR BTL) OPTIME
TOPICAL | Status: DC | PRN
  Administered 2017-05-12: 1000 mL

## 2017-05-12 MED ORDER — ALUM & MAG HYDROXIDE-SIMETH 200-200-20 MG/5ML PO SUSP
15.0000 mL | Freq: Once | ORAL | Status: AC
  Administered 2017-05-12: 15 mL via ORAL
  Filled 2017-05-12: qty 30

## 2017-05-12 MED ORDER — METHOCARBAMOL 500 MG PO TABS
ORAL_TABLET | ORAL | Status: AC
  Filled 2017-05-12: qty 1

## 2017-05-12 MED ORDER — ACETAMINOPHEN 500 MG PO TABS
1000.0000 mg | ORAL_TABLET | Freq: Four times a day (QID) | ORAL | Status: DC
  Filled 2017-05-12 (×2): qty 2

## 2017-05-12 MED ORDER — HYDROCODONE-ACETAMINOPHEN 5-325 MG PO TABS: 1.0000 | ORAL_TABLET | Freq: Four times a day (QID) | ORAL | Status: DC | PRN

## 2017-05-12 MED ORDER — OXYCODONE HCL 5 MG PO TABS
ORAL_TABLET | ORAL | Status: AC
  Filled 2017-05-12: qty 2

## 2017-05-12 MED ORDER — VITAMIN D 1000 UNITS PO TABS
1000.0000 [IU] | ORAL_TABLET | Freq: Every day | ORAL | Status: DC
  Administered 2017-05-13: 1000 [IU] via ORAL
  Filled 2017-05-12 (×2): qty 1

## 2017-05-12 MED ORDER — POVIDONE-IODINE 10 % EX SWAB: 2.0000 "application " | Freq: Once | CUTANEOUS | Status: DC

## 2017-05-12 MED ORDER — MORPHINE SULFATE (PF) 4 MG/ML IV SOLN: 0.5000 mg | INTRAVENOUS | Status: DC | PRN

## 2017-05-12 MED ORDER — LIDOCAINE 2% (20 MG/ML) 5 ML SYRINGE
INTRAMUSCULAR | Status: AC
  Filled 2017-05-12: qty 5

## 2017-05-12 MED ORDER — CEFAZOLIN SODIUM-DEXTROSE 2-4 GM/100ML-% IV SOLN
INTRAVENOUS | Status: AC
  Filled 2017-05-12: qty 100

## 2017-05-12 MED ORDER — FAMOTIDINE IN NACL 20-0.9 MG/50ML-% IV SOLN
20.0000 mg | Freq: Once | INTRAVENOUS | Status: AC
  Administered 2017-05-12: 20 mg via INTRAVENOUS
  Filled 2017-05-12: qty 50

## 2017-05-12 MED ORDER — MORPHINE SULFATE (PF) 4 MG/ML IV SOLN
1.0000 mg | INTRAVENOUS | Status: DC | PRN
  Administered 2017-05-12: 2 mg via INTRAVENOUS
  Filled 2017-05-12: qty 1

## 2017-05-12 MED ORDER — FENTANYL CITRATE (PF) 100 MCG/2ML IJ SOLN
INTRAMUSCULAR | Status: DC | PRN
  Administered 2017-05-12: 100 ug via INTRAVENOUS
  Administered 2017-05-12 (×3): 50 ug via INTRAVENOUS

## 2017-05-12 MED ORDER — FENTANYL CITRATE (PF) 250 MCG/5ML IJ SOLN
INTRAMUSCULAR | Status: AC
  Filled 2017-05-12: qty 5

## 2017-05-12 MED ORDER — ONDANSETRON HCL 4 MG/2ML IJ SOLN
INTRAMUSCULAR | Status: AC
  Filled 2017-05-12: qty 2

## 2017-05-12 MED ORDER — CHLORHEXIDINE GLUCONATE 4 % EX LIQD: 60.0000 mL | Freq: Once | CUTANEOUS | Status: DC

## 2017-05-12 MED ORDER — ROCURONIUM BROMIDE 100 MG/10ML IV SOLN
INTRAVENOUS | Status: DC | PRN
  Administered 2017-05-12: 50 mg via INTRAVENOUS

## 2017-05-12 MED ORDER — PHENYLEPHRINE 40 MCG/ML (10ML) SYRINGE FOR IV PUSH (FOR BLOOD PRESSURE SUPPORT)
PREFILLED_SYRINGE | INTRAVENOUS | Status: AC
  Filled 2017-05-12: qty 10

## 2017-05-12 MED ORDER — LACTATED RINGERS IV SOLN
INTRAVENOUS | Status: DC
  Administered 2017-05-12: 15:00:00 via INTRAVENOUS

## 2017-05-12 MED ORDER — ACETAMINOPHEN 10 MG/ML IV SOLN
INTRAVENOUS | Status: AC
  Filled 2017-05-12: qty 100

## 2017-05-12 MED ORDER — PHENOL 1.4 % MT LIQD: 1.0000 | OROMUCOSAL | Status: DC | PRN

## 2017-05-12 MED ORDER — DEXAMETHASONE SODIUM PHOSPHATE 4 MG/ML IJ SOLN
INTRAMUSCULAR | Status: DC | PRN
  Administered 2017-05-12: 8 mg via INTRAVENOUS

## 2017-05-12 MED ORDER — POTASSIUM CHLORIDE IN NACL 20-0.9 MEQ/L-% IV SOLN
INTRAVENOUS | Status: DC
  Filled 2017-05-12: qty 1000

## 2017-05-12 MED ORDER — CALCIUM CARBONATE 600 MG PO TABS: 600.0000 mg | ORAL_TABLET | Freq: Every day | ORAL | Status: DC

## 2017-05-12 MED ORDER — ENOXAPARIN SODIUM 40 MG/0.4ML ~~LOC~~ SOLN
40.0000 mg | SUBCUTANEOUS | Status: DC
  Administered 2017-05-13: 40 mg via SUBCUTANEOUS
  Filled 2017-05-12: qty 0.4

## 2017-05-12 MED ORDER — CEFAZOLIN SODIUM-DEXTROSE 2-4 GM/100ML-% IV SOLN
2.0000 g | INTRAVENOUS | Status: AC
  Administered 2017-05-12: 2 g via INTRAVENOUS

## 2017-05-12 MED ORDER — OXYCODONE HCL 5 MG PO TABS: 5.0000 mg | ORAL_TABLET | Freq: Once | ORAL | Status: DC | PRN

## 2017-05-12 MED ORDER — SODIUM CHLORIDE 0.9 % IV SOLN
INTRAVENOUS | Status: DC
  Administered 2017-05-12: 23:00:00 via INTRAVENOUS

## 2017-05-12 MED ORDER — ROCURONIUM BROMIDE 10 MG/ML (PF) SYRINGE
PREFILLED_SYRINGE | INTRAVENOUS | Status: AC
  Filled 2017-05-12: qty 5

## 2017-05-12 MED ORDER — METOCLOPRAMIDE HCL 5 MG PO TABS
5.0000 mg | ORAL_TABLET | Freq: Three times a day (TID) | ORAL | Status: DC | PRN
Start: 2017-05-12 — End: 2017-05-13

## 2017-05-12 MED ORDER — MORPHINE SULFATE (PF) 2 MG/ML IV SOLN: 1.0000 mg | INTRAVENOUS | Status: DC | PRN

## 2017-05-12 MED ORDER — METHOCARBAMOL 500 MG PO TABS
500.0000 mg | ORAL_TABLET | Freq: Four times a day (QID) | ORAL | Status: DC | PRN
  Administered 2017-05-12 – 2017-05-13 (×2): 500 mg via ORAL
  Filled 2017-05-12: qty 1

## 2017-05-12 MED ORDER — PROPOFOL 10 MG/ML IV BOLUS
INTRAVENOUS | Status: DC | PRN
  Administered 2017-05-12: 180 mg via INTRAVENOUS

## 2017-05-12 MED ORDER — PHENYLEPHRINE 40 MCG/ML (10ML) SYRINGE FOR IV PUSH (FOR BLOOD PRESSURE SUPPORT)
PREFILLED_SYRINGE | INTRAVENOUS | Status: DC | PRN
  Administered 2017-05-12: 80 ug via INTRAVENOUS

## 2017-05-12 MED ORDER — OXYCODONE HCL 5 MG/5ML PO SOLN: 5.0000 mg | Freq: Once | ORAL | Status: DC | PRN

## 2017-05-12 MED ORDER — SCOPOLAMINE 1 MG/3DAYS TD PT72
MEDICATED_PATCH | TRANSDERMAL | Status: AC
  Administered 2017-05-12: 1.5 mg via TRANSDERMAL
  Filled 2017-05-12: qty 1

## 2017-05-12 MED ORDER — PROPOFOL 10 MG/ML IV BOLUS
INTRAVENOUS | Status: AC
  Filled 2017-05-12: qty 20

## 2017-05-12 MED ORDER — ONDANSETRON HCL 4 MG/2ML IJ SOLN
4.0000 mg | Freq: Four times a day (QID) | INTRAMUSCULAR | Status: DC | PRN
  Administered 2017-05-12: 4 mg via INTRAVENOUS

## 2017-05-12 MED ORDER — ONDANSETRON HCL 4 MG/2ML IJ SOLN
INTRAMUSCULAR | Status: DC | PRN
  Administered 2017-05-12: 4 mg via INTRAVENOUS

## 2017-05-12 MED ORDER — ADULT MULTIVITAMIN W/MINERALS CH
1.0000 | ORAL_TABLET | Freq: Every day | ORAL | Status: DC
Start: 2017-05-12 — End: 2017-05-13
  Filled 2017-05-12 (×3): qty 1

## 2017-05-12 MED ORDER — ACETAMINOPHEN 10 MG/ML IV SOLN
1000.0000 mg | INTRAVENOUS | Status: AC
  Administered 2017-05-12: 1000 mg via INTRAVENOUS

## 2017-05-12 MED ORDER — MIDAZOLAM HCL 2 MG/2ML IJ SOLN
INTRAMUSCULAR | Status: AC
  Filled 2017-05-12: qty 2

## 2017-05-12 MED ORDER — EPHEDRINE SULFATE-NACL 50-0.9 MG/10ML-% IV SOSY
PREFILLED_SYRINGE | INTRAVENOUS | Status: DC | PRN
  Administered 2017-05-12: 5 mg via INTRAVENOUS

## 2017-05-12 MED ORDER — POLYETHYLENE GLYCOL 3350 17 G PO PACK
17.0000 g | PACK | Freq: Every day | ORAL | Status: DC
  Filled 2017-05-12: qty 1

## 2017-05-12 MED ORDER — METOCLOPRAMIDE HCL 5 MG/ML IJ SOLN
INTRAMUSCULAR | Status: AC
  Administered 2017-05-12: 10 mg via INTRAVENOUS
  Filled 2017-05-12: qty 2

## 2017-05-12 MED ORDER — DOCUSATE SODIUM 100 MG PO CAPS
100.0000 mg | ORAL_CAPSULE | Freq: Two times a day (BID) | ORAL | Status: DC
  Filled 2017-05-12 (×2): qty 1

## 2017-05-12 MED ORDER — DEXTROSE 5 % IV SOLN
500.0000 mg | Freq: Four times a day (QID) | INTRAVENOUS | Status: DC | PRN
  Filled 2017-05-12: qty 5

## 2017-05-12 MED ORDER — OXYCODONE HCL 5 MG PO TABS
5.0000 mg | ORAL_TABLET | ORAL | Status: DC | PRN
  Administered 2017-05-12: 10 mg via ORAL
  Administered 2017-05-13: 5 mg via ORAL
  Filled 2017-05-12: qty 1

## 2017-05-12 SURGICAL SUPPLY — 42 items
BIT DRILL 4.8X300 (BIT) ×2 IMPLANT
BRUSH SCRUB SURG 4.25 DISP (MISCELLANEOUS) ×4 IMPLANT
COVER PERINEAL POST (MISCELLANEOUS) ×2 IMPLANT
COVER SURGICAL LIGHT HANDLE (MISCELLANEOUS) ×4 IMPLANT
DRAPE C-ARMOR (DRAPES) ×2 IMPLANT
DRAPE STERI IOBAN 125X83 (DRAPES) ×2 IMPLANT
DRSG MEPILEX BORDER 4X4 (GAUZE/BANDAGES/DRESSINGS) ×2 IMPLANT
ELECT REM PT RETURN 9FT ADLT (ELECTROSURGICAL) ×2
ELECTRODE REM PT RTRN 9FT ADLT (ELECTROSURGICAL) ×1 IMPLANT
GLOVE BIO SURGEON STRL SZ7.5 (GLOVE) ×2 IMPLANT
GLOVE BIO SURGEON STRL SZ8 (GLOVE) ×2 IMPLANT
GLOVE BIOGEL PI IND STRL 7.5 (GLOVE) ×1 IMPLANT
GLOVE BIOGEL PI IND STRL 8 (GLOVE) ×1 IMPLANT
GLOVE BIOGEL PI INDICATOR 7.5 (GLOVE) ×1
GLOVE BIOGEL PI INDICATOR 8 (GLOVE) ×1
GOWN STRL REUS W/ TWL LRG LVL3 (GOWN DISPOSABLE) ×2 IMPLANT
GOWN STRL REUS W/ TWL XL LVL3 (GOWN DISPOSABLE) ×1 IMPLANT
GOWN STRL REUS W/TWL LRG LVL3 (GOWN DISPOSABLE) ×2
GOWN STRL REUS W/TWL XL LVL3 (GOWN DISPOSABLE) ×1
KIT BASIN OR (CUSTOM PROCEDURE TRAY) ×2 IMPLANT
KIT ROOM TURNOVER OR (KITS) ×2 IMPLANT
LINER BOOT UNIVERSAL DISP (MISCELLANEOUS) ×2 IMPLANT
MANIFOLD NEPTUNE II (INSTRUMENTS) ×2 IMPLANT
NS IRRIG 1000ML POUR BTL (IV SOLUTION) ×2 IMPLANT
PACK GENERAL/GYN (CUSTOM PROCEDURE TRAY) ×2 IMPLANT
PAD ARMBOARD 7.5X6 YLW CONV (MISCELLANEOUS) ×4 IMPLANT
PIN GUIDE DRILL TIP 2.8X300 (DRILL) ×6 IMPLANT
SCREW CANN 8.0X100 HIP (Screw) ×2 IMPLANT
SCREW PARTIAL THREAD 8.0X90MM (Screw) ×4 IMPLANT
STAPLER VISISTAT 35W (STAPLE) ×4 IMPLANT
SUT ETHILON 3 0 PS 1 (SUTURE) ×2 IMPLANT
SUT VIC AB 0 CT1 27 (SUTURE) ×2
SUT VIC AB 0 CT1 27XBRD ANBCTR (SUTURE) ×1 IMPLANT
SUT VIC AB 1 CT1 27 (SUTURE) ×1
SUT VIC AB 1 CT1 27XBRD ANBCTR (SUTURE) ×1 IMPLANT
SUT VIC AB 2-0 CT1 27 (SUTURE) ×2
SUT VIC AB 2-0 CT1 TAPERPNT 27 (SUTURE) ×1 IMPLANT
TOWEL OR 17X24 6PK STRL BLUE (TOWEL DISPOSABLE) ×2 IMPLANT
TOWEL OR 17X26 10 PK STRL BLUE (TOWEL DISPOSABLE) ×4 IMPLANT
WASHER 8.0 (Orthopedic Implant) ×3 IMPLANT
WASHER CANN FLAT 8 (Orthopedic Implant) ×3 IMPLANT
WATER STERILE IRR 1000ML POUR (IV SOLUTION) ×2 IMPLANT

## 2017-05-12 NOTE — Anesthesia Postprocedure Evaluation (Signed)
Anesthesia Post Note  Patient: Kaitlyn Spencer  Procedure(s) Performed: RIGHT CANNULATED HIP PINNING (Right Hip)     Patient location during evaluation: PACU Anesthesia Type: General Level of consciousness: awake, awake and alert and oriented Pain management: pain level controlled Vital Signs Assessment: post-procedure vital signs reviewed and stable Respiratory status: spontaneous breathing, nonlabored ventilation and respiratory function stable Cardiovascular status: blood pressure returned to baseline Anesthetic complications: no    Last Vitals:  Vitals:   05/12/17 1430 05/12/17 1800  BP: 121/76 (!) 96/58  Pulse: (!) 52 74  Resp: 17 15  Temp:    SpO2: 99% 100%    Last Pain:  Vitals:   05/12/17 1800  TempSrc:   PainSc: 8                  Jorey Dollard COKER

## 2017-05-12 NOTE — Op Note (Signed)
05/12/2017  5:55 PM  PATIENT:  Kaitlyn Spencer  66 y.o. female  PRE-OPERATIVE DIAGNOSIS:  Right Valgus Impacted Femoral Neck Fracture  POST-OPERATIVE DIAGNOSIS:  Right Valgus Impacted Femoral Neck Fracture  PROCEDURE:  Procedure(s): RIGHT CANNULATED HIP PINNING (Right) with Biomet 8.69mm screws  SURGEON:  Surgeon(s) and Role:    Altamese Trumbull, MD - Primary  PHYSICIAN ASSISTANT: PA Student  ANESTHESIA:   general  I/O:  Total I/O In: 1000 [I.V.:1000] Out: -   SPECIMEN:  No Specimen  TOURNIQUET:  * No tourniquets in log *  DICTATION: .Other Dictation: Dictation Number [id number]

## 2017-05-12 NOTE — Anesthesia Preprocedure Evaluation (Signed)
Anesthesia Evaluation  Patient identified by MRN, date of birth, ID band Patient awake    Reviewed: Allergy & Precautions, NPO status , Patient's Chart, lab work & pertinent test results  Airway Mallampati: I  TM Distance: >3 FB Neck ROM: Full    Dental  (+) Teeth Intact   Pulmonary neg pulmonary ROS,    breath sounds clear to auscultation       Cardiovascular negative cardio ROS   Rhythm:Regular Rate:Normal     Neuro/Psych negative neurological ROS  negative psych ROS   GI/Hepatic negative GI ROS, Neg liver ROS,   Endo/Other  negative endocrine ROS  Renal/GU negative Renal ROS  negative genitourinary   Musculoskeletal negative musculoskeletal ROS (+)   Abdominal (+) - obese,   Peds negative pediatric ROS (+)  Hematology negative hematology ROS (+)   Anesthesia Other Findings   Reproductive/Obstetrics negative OB ROS                             Anesthesia Physical Anesthesia Plan  ASA: I  Anesthesia Plan: General   Post-op Pain Management:    Induction: Intravenous  PONV Risk Score and Plan: 4 or greater and Ondansetron, Dexamethasone, Midazolam, Scopolamine patch - Pre-op, Propofol infusion and Treatment may vary due to age or medical condition  Airway Management Planned: Oral ETT  Additional Equipment:   Intra-op Plan:   Post-operative Plan:   Informed Consent: I have reviewed the patients History and Physical, chart, labs and discussed the procedure including the risks, benefits and alternatives for the proposed anesthesia with the patient or authorized representative who has indicated his/her understanding and acceptance.   Dental advisory given  Plan Discussed with: CRNA  Anesthesia Plan Comments:         Anesthesia Quick Evaluation
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
## 2017-05-12 NOTE — Anesthesia Procedure Notes (Signed)
Procedure Name: Intubation Date/Time: 05/12/2017 4:09 PM Performed by: Orlie Dakin Pre-anesthesia Checklist: Patient identified, Suction available, Emergency Drugs available, Patient being monitored and Timeout performed Patient Re-evaluated:Patient Re-evaluated prior to induction Oxygen Delivery Method: Circle system utilized Preoxygenation: Pre-oxygenation with 100% oxygen Induction Type: IV induction Ventilation: Mask ventilation without difficulty Laryngoscope Size: Miller and 3 Grade View: Grade I Tube type: Oral Tube size: 7.5 mm Number of attempts: 1 Airway Equipment and Method: Stylet Placement Confirmation: ETT inserted through vocal cords under direct vision,  positive ETCO2 and breath sounds checked- equal and bilateral Secured at: 22 cm Tube secured with: Tape Dental Injury: Teeth and Oropharynx as per pre-operative assessment  Comments: 4x4s bite block used.
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
## 2017-05-12 NOTE — Transfer of Care (Signed)
Immediate Anesthesia Transfer of Care Note  Patient: [facility]  Procedure(s) Performed: RIGHT CANNULATED HIP PINNING (Right Hip)  Patient Location: PACU  Anesthesia Type:General  Level of Consciousness: awake and patient cooperative  Airway & Oxygen Therapy: Patient Spontanous Breathing and Patient connected to nasal cannula oxygen  Post-op Assessment: Report given to RN and Post -op Vital signs reviewed and stable  Post vital signs: Reviewed and stable  Last Vitals:  Vitals:   05/12/17 1415 05/12/17 1430  BP: 98/84 121/76  Pulse: (!) 57 (!) 52  Resp: 15 17  Temp:    SpO2: 100% 99%    Last Pain:  Vitals:   05/12/17 1301  TempSrc:   PainSc: 5          Complications: No apparent anesthesia complications

## 2017-05-13 ENCOUNTER — Encounter (HOSPITAL_COMMUNITY): Payer: Self-pay | Admitting: Orthopedic Surgery

## 2017-05-13 ENCOUNTER — Ambulatory Visit: Payer: Medicare Other | Admitting: Gastroenterology

## 2017-05-13 LAB — BASIC METABOLIC PANEL
Anion gap: 8 (ref 5–15)
BUN: 5 mg/dL — AB (ref 6–20)
CHLORIDE: 105 mmol/L (ref 101–111)
CO2: 26 mmol/L (ref 22–32)
CREATININE: 0.67 mg/dL (ref 0.44–1.00)
Calcium: 8.6 mg/dL — ABNORMAL LOW (ref 8.9–10.3)
GFR calc Af Amer: >60 mL/min (ref >60)
GFR calc non Af Amer: >60 mL/min (ref >60)
Glucose, Bld: 159 mg/dL — ABNORMAL HIGH (ref 65–99)
Potassium: 4.3 mmol/L (ref 3.5–5.1)
SODIUM: 139 mmol/L (ref 135–145)

## 2017-05-13 LAB — CBC
HCT: 36.7 % (ref 36.0–46.0)
HEMOGLOBIN: 12 g/dL (ref 12.0–15.0)
MCH: 30.2 pg (ref 26.0–34.0)
MCHC: 32.7 g/dL (ref 30.0–36.0)
MCV: 92.2 fL (ref 78.0–100.0)
Platelets: 215 10*3/uL (ref 150–400)
RBC: 3.98 MIL/uL (ref 3.87–5.11)
RDW: 13.2 % (ref 11.5–15.5)
WBC: 7.5 10*3/uL (ref 4.0–10.5)

## 2017-05-13 LAB — PTH, INTACT AND CALCIUM
Calcium, Total (PTH): 9.2 mg/dL (ref 8.7–10.3)
PTH: 30 pg/mL (ref 15–65)

## 2017-05-13 LAB — VITAMIN D 25 HYDROXY (VIT D DEFICIENCY, FRACTURES): Vit D, 25-Hydroxy: 56.6 ng/mL (ref 30.0–100.0)

## 2017-05-13 LAB — CALCITRIOL (1,25 DI-OH VIT D): Vit D, 1,25-Dihydroxy: 81.3 pg/mL — ABNORMAL HIGH (ref 19.9–79.3)

## 2017-05-13 MED ORDER — METHOCARBAMOL 500 MG PO TABS: 500.0000 mg | ORAL_TABLET | Freq: Four times a day (QID) | ORAL | 1 refills | Status: DC | PRN

## 2017-05-13 MED ORDER — ENOXAPARIN (LOVENOX) PATIENT EDUCATION KIT: 1.0000 | PACK | Freq: Once | 0 refills | Status: AC

## 2017-05-13 MED ORDER — ACETAMINOPHEN 500 MG PO TABS: 500.0000 mg | ORAL_TABLET | Freq: Four times a day (QID) | ORAL | 0 refills | Status: DC | PRN

## 2017-05-13 MED ORDER — PANTOPRAZOLE SODIUM 40 MG PO PACK
40.0000 mg | PACK | Freq: Once | ORAL | Status: AC
  Administered 2017-05-13: 40 mg via ORAL
  Filled 2017-05-13: qty 20

## 2017-05-13 MED ORDER — PANTOPRAZOLE SODIUM 20 MG PO TBEC: 20.0000 mg | DELAYED_RELEASE_TABLET | Freq: Every day | ORAL | 1 refills | Status: DC

## 2017-05-13 MED ORDER — ENOXAPARIN SODIUM 40 MG/0.4ML ~~LOC~~ SOLN: 40.0000 mg | SUBCUTANEOUS | 0 refills | Status: DC

## 2017-05-13 MED ORDER — DOCUSATE SODIUM 100 MG PO CAPS: 100.0000 mg | ORAL_CAPSULE | Freq: Two times a day (BID) | ORAL | 0 refills | Status: DC

## 2017-05-13 MED ORDER — OXYCODONE-ACETAMINOPHEN 5-325 MG PO TABS: 1.0000 | ORAL_TABLET | Freq: Four times a day (QID) | ORAL | 0 refills | Status: DC | PRN
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
## 2017-05-13 NOTE — Progress Notes (Signed)
Orthopedic Tech Progress Note Patient Details:  Kaitlyn Spencer Jul 11, 1951 [id number]  Ortho Devices Type of Ortho Device: Crutches Ortho Device/Splint Interventions: Application   [name] 05/13/2017, 3:42 PM

## 2017-05-13 NOTE — Progress Notes (Signed)
Pt discharged home with husband. AVS and scripts reviewed in full. Pt notified that [place], [place] changed script from Lovenox to enteric coated asa 325mg  once/daily due to lovenox cost. Pt and husband acknowledge understanding and all questions answered. All DME and belongings sent with patient. VSS. BP 121/70 (BP Location: Left Arm)   Pulse 60   Temp 98.1 F (36.7 C) (Oral)   Resp 16   Ht 5\' 8"  (1.727 m)   Wt 60.8 kg (134 lb)   SpO2 99%   BMI 20.37 kg/m

## 2017-05-13 NOTE — Care Management Note (Signed)
Case Management Note  Patient Details  Name: Kaitlyn Spencer MRN: [id number] Date of Birth: [birth year]  Subjective/Objective:                 Spoke with patient at the bedside. She states she would like to use [facility] for [facility] services at [place]. Referral made to [facility] for [facility] RW and 3/1.    Action/Plan:   Expected Discharge Date:  05/13/17               Expected Discharge Plan:  [place]  In-House Referral:     Discharge planning Services  CM Consult  Post Acute Care Choice:  Home Health, Durable Medical Equipment Choice offered to:  Patient  DME Arranged:  3-N-1, Walker rolling DME Agency:  [place]:  PT [place]:  [place]  Status of Service:  Completed, signed off  If discussed at [place] of Stay Meetings, dates discussed:    Additional Comments:  Carles Collet, RN 05/13/2017, 2:11 PM
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
## 2017-05-13 NOTE — Progress Notes (Addendum)
Lovenox:  Jacqualin Combes, RN        # 5.  S/W CURTIS @ [facility] # [phone]   1. LOVENOX  40 MG DAILY  COVER- YES  CO-PAY- $ 395.00  TIER- 3 DRUG  PRIOR APPROVAL- NO   2. ENOXAPARIN 40 MG DAILY  COVER- YES  CO-PAY- $ 395.00  TIER- 3 DRUG  PRIOR APPROVAL- NO   ( PATIENT HAS NO DEDUCTIBLE )   PHARMACY : ANY RETAIL    Paged Verline Lema PA Ortho, to notify of drug cost and request substitution.  15:22 Spoke w Verline Lema PA, over the phone at patient's bedside, notified of Lovenox cost. He directed to DC Lovenox, and substitute 325 mg EC ASA PO QD. Patient and spouse verbalized understanding. CM updated Ginger RN of change.

## 2017-05-13 NOTE — Discharge Summary (Signed)
Orthopaedic Trauma Service (OTS)  Patient ID: Kaitlyn Spencer MRN: [id number] DOB/AGE: 65-22-1952 65 y.o.  Admit date: 05/12/2017 Discharge date: 05/13/2017  Admission Diagnoses: Right femoral neck fracture  Discharge Diagnoses:  Active Problems:   Femoral neck fracture ([place])   Procedures Performed: 05/12/2017-Dr. Handy Cannulated screw fixation right femoral neck fracture  Discharged Condition: good  Hospital Course:   The 66 year old white female admitted to [facility] on 05/12/2017 directly from her orthopedic office with acute right femoral neck fracture.  Please see H&P for full details.  Patient taken to the operating room on the day of presentation.  Procedure above was performed.  After surgery patient was transferred to the PACU for recovery from anesthesia and then brought to the orthopedic floor for observation, pain control and therapies.  Patient remained overnight for pain control and therapies.  On postoperative day #1 she is doing fantastic and mobilize well with therapy.  No acute issues were noted.  Patient was discharged in stable condition on postoperative day #1.  Patient will be on Lovenox for DVT and PE prophylaxis for the next 14 days.  At the time of discharge patient was tolerating a regular diet and able to void without difficulty  Consults: None  Significant Diagnostic Studies: labs:   Results for Kaitlyn, Spencer (MRN [id number]) as of 05/13/2017 12:25  Ref. Range 05/12/2017 13:25 05/12/2017 14:10 05/12/2017 19:47 05/13/2017 04:57  Sodium Latest Ref Range: 135 - 145 mmol/L  138  139  Potassium Latest Ref Range: 3.5 - 5.1 mmol/L  3.7  4.3  Chloride Latest Ref Range: 101 - 111 mmol/L  107  105  CO2 Latest Ref Range: 22 - 32 mmol/L  23  26  Glucose Latest Ref Range: 65 - 99 mg/dL  101 (H)  159 (H)  BUN Latest Ref Range: 6 - 20 mg/dL  9  5 (L)  Creatinine Latest Ref Range: 0.44 - 1.00 mg/dL  0.66 0.65 0.67  Calcium Latest Ref Range: 8.9 - 10.3 mg/dL   9.0  8.6 (L)  Anion gap Latest Ref Range: 5 - _0 Phosphorus Latest Ref Range: 2.5 - 4.6 mg/dL  3.8    Magnesium Latest Ref Range: 1.7 - 2.4 mg/dL  2.0    Alkaline Phosphatase Latest Ref Range: 38 - 126 U/L  50    Albumin Latest Ref Range: 3.5 - 5.0 g/dL  3.8    AST Latest Ref Range: 15 - 41 U/L  20    ALT Latest Ref Range: 14 - 54 U/L  17    Total Protein Latest Ref Range: 6.5 - 8.1 g/dL  6.2 (L)    Total Bilirubin Latest Ref Range: 0.3 - 1.2 mg/dL  0.8    PREALBUMIN Latest Ref Range: 18 - 38 mg/dL 24.8     GFR, Est Non African American Latest Ref Range: >60 mL/min  >60 >60 >60  GFR, Est African American Latest Ref Range: >60 mL/min  >60 >60 >60  Vitamin D, 25-Hydroxy Latest Ref Range: 30.0 - 100.0 ng/mL  56.6    WBC Latest Ref Range: 4.0 - 10.5 K/uL  7.8 9.0 7.5  RBC Latest Ref Range: 3.87 - 5.11 MIL/uL  4.29 4.22 3.98  Hemoglobin Latest Ref Range: 12.0 - 15.0 g/dL  12.9 12.8 12.0  HCT Latest Ref Range: 36.0 - 46.0 %  39.3 39.0 36.7  MCV Latest Ref Range: 78.0 - 100.0 fL  91.6 92.4 92.2  MCH Latest Ref Range: 26.0 -  34.0 pg  30.1 30.3 30.2  MCHC Latest Ref Range: 30.0 - 36.0 g/dL  32.8 32.8 32.7  RDW Latest Ref Range: 11.5 - 15.5 %  13.6 13.2 13.2  Platelets Latest Ref Range: 150 - 400 K/uL  211 206 215  PTH, Intact Latest Ref Range: 15 - 65 pg/mL  30    Calcium, Total (PTH) Latest Ref Range: 8.7 - 10.3 mg/dL  9.2    PTH Interp Unknown  Comment    TSH Latest Ref Range: 0.350 - 4.500 uIU/mL 1.420       Treatments: IV hydration, antibiotics: Ancef, analgesia: Morphine, OxyIR, Tylenol, anticoagulation: LMW heparin, therapies: PT, OT and RN and surgery: As above  Discharge Exam:    Orthopedic Trauma Service Progress Note    Patient ID: Kaitlyn Spencer MRN: [id number] DOB/AGE: [birth year] 66 y.o.   Subjective:   Doing very well Right hip pain is minimal Complaints primarily of GI upset Wants to go home today     Review of Systems  Constitutional: Negative for chills and  fever.  Eyes: Negative for blurred vision.  Respiratory: Negative for shortness of breath and wheezing.   Cardiovascular: Negative for chest pain and palpitations.  Gastrointestinal: Negative for abdominal pain, nausea and vomiting.  Genitourinary: Negative for dysuria.  Neurological: Negative for tingling and sensory change.      Objective:    VITALS:         Vitals:    05/12/17 1915 05/12/17 1953 05/13/17 0050 05/13/17 0542  BP:   (!) 145/78 128/69 122/65  Pulse: 60 65 (!) 56 (!) 52  Resp: _0 Temp: 98 F (36.7 C) 97.7 F (36.5 C) 98.2 F (36.8 C) 98 F (36.7 C)  TempSrc:   Oral Oral Oral  SpO2: 100% 97% 96% 100%  Weight:          Height:              Estimated body mass index is 20.37 kg/m as calculated from the following:   Height as of this encounter: _1  (1.727 m).   Weight as of this encounter: 60.8 kg (134 lb).     Intake/Output      10/25 0701 - 10/26 0700 10/26 0701 - 10/27 0700   P.O. 240    I.V. (mL/kg) 1475 (24.3)    Total Intake(mL/kg) 1715 (28.2)    Urine (mL/kg/hr) 400    Total Output 400     Net +1315          Urine Occurrence 4 x       LABS   Lab Results Last 24 Hours       Results for orders placed or performed during the hospital encounter of 05/12/17 (from the past 24 hour(s))  Basic metabolic panel     Status: Abnormal    Collection Time: 05/12/17 12:53 PM  Result Value Ref Range    Sodium 138 135 - 145 mmol/L    Potassium 3.4 (L) 3.5 - 5.1 mmol/L    Chloride 106 101 - 111 mmol/L    CO2 25 22 - 32 mmol/L    Glucose, Bld 109 (H) 65 - 99 mg/dL    BUN 9 6 - 20 mg/dL    Creatinine, Ser 0.70 0.44 - 1.00 mg/dL    Calcium 9.1 8.9 - 10.3 mg/dL    GFR calc non Af Amer >60 >60 mL/min    GFR calc Af Amer >60 >60 mL/min    Anion gap  7 5 - 15  CBC WITH DIFFERENTIAL     Status: None    Collection Time: 05/12/17 12:53 PM  Result Value Ref Range    WBC 8.3 4.0 - 10.5 K/uL    RBC 4.48 3.87 - 5.11 MIL/uL    Hemoglobin 13.8 12.0 -  15.0 g/dL    HCT 41.2 36.0 - 46.0 %    MCV 92.0 78.0 - 100.0 fL    MCH 30.8 26.0 - 34.0 pg    MCHC 33.5 30.0 - 36.0 g/dL    RDW 13.0 11.5 - 15.5 %    Platelets 225 150 - 400 K/uL    Neutrophils Relative % 67 %    Neutro Abs 5.5 1.7 - 7.7 K/uL    Lymphocytes Relative 24 %    Lymphs Abs 2.0 0.7 - 4.0 K/uL    Monocytes Relative 9 %    Monocytes Absolute 0.7 0.1 - 1.0 K/uL    Eosinophils Relative 0 %    Eosinophils Absolute 0.0 0.0 - 0.7 K/uL    Basophils Relative 0 %    Basophils Absolute 0.0 0.0 - 0.1 K/uL  Protime-INR     Status: None    Collection Time: 05/12/17 12:53 PM  Result Value Ref Range    Prothrombin Time 12.8 11.4 - 15.2 seconds    INR 0.97    Type and screen [place]     Status: None    Collection Time: 05/12/17 12:53 PM  Result Value Ref Range    ABO/RH(D) O POS      Antibody Screen NEG      Sample Expiration 05/15/2017    ABO/Rh     Status: None    Collection Time: 05/12/17 12:53 PM  Result Value Ref Range    ABO/RH(D) O POS    TSH     Status: None    Collection Time: 05/12/17  1:25 PM  Result Value Ref Range    TSH 1.420 0.350 - 4.500 uIU/mL  Prealbumin     Status: None    Collection Time: 05/12/17  1:25 PM  Result Value Ref Range    Prealbumin 24.8 18 - 38 mg/dL  CBC     Status: None    Collection Time: 05/12/17  2:10 PM  Result Value Ref Range    WBC 7.8 4.0 - 10.5 K/uL    RBC 4.29 3.87 - 5.11 MIL/uL    Hemoglobin 12.9 12.0 - 15.0 g/dL    HCT 39.3 36.0 - 46.0 %    MCV 91.6 78.0 - 100.0 fL    MCH 30.1 26.0 - 34.0 pg    MCHC 32.8 30.0 - 36.0 g/dL    RDW 13.6 11.5 - 15.5 %    Platelets 211 150 - 400 K/uL  Comprehensive metabolic panel     Status: Abnormal    Collection Time: 05/12/17  2:10 PM  Result Value Ref Range    Sodium 138 135 - 145 mmol/L    Potassium 3.7 3.5 - 5.1 mmol/L    Chloride 107 101 - 111 mmol/L    CO2 23 22 - 32 mmol/L    Glucose, Bld 101 (H) 65 - 99 mg/dL    BUN 9 6 - 20 mg/dL    Creatinine, Ser 0.66 0.44  - 1.00 mg/dL    Calcium 9.0 8.9 - 10.3 mg/dL    Total Protein 6.2 (L) 6.5 - 8.1 g/dL    Albumin 3.8 3.5 - 5.0 g/dL  AST 20 15 - 41 U/L    ALT 17 14 - 54 U/L    Alkaline Phosphatase 50 38 - 126 U/L    Total Bilirubin 0.8 0.3 - 1.2 mg/dL    GFR calc non Af Amer >60 >60 mL/min    GFR calc Af Amer >60 >60 mL/min    Anion gap 8 5 - 15  VITAMIN D 25 Hydroxy (Vit-D Deficiency, Fractures)     Status: None    Collection Time: 05/12/17  2:10 PM  Result Value Ref Range    Vit D, 25-Hydroxy 56.6 30.0 - 100.0 ng/mL  PTH, intact and calcium     Status: None    Collection Time: 05/12/17  2:10 PM  Result Value Ref Range    PTH 30 15 - 65 pg/mL    Calcium, Total (PTH) 9.2 8.7 - 10.3 mg/dL    PTH Interp Comment    Magnesium     Status: None    Collection Time: 05/12/17  2:10 PM  Result Value Ref Range    Magnesium 2.0 1.7 - 2.4 mg/dL  Phosphorus     Status: None    Collection Time: 05/12/17  2:10 PM  Result Value Ref Range    Phosphorus 3.8 2.5 - 4.6 mg/dL  CBC     Status: None    Collection Time: 05/12/17  7:47 PM  Result Value Ref Range    WBC 9.0 4.0 - 10.5 K/uL    RBC 4.22 3.87 - 5.11 MIL/uL    Hemoglobin 12.8 12.0 - 15.0 g/dL    HCT 39.0 36.0 - 46.0 %    MCV 92.4 78.0 - 100.0 fL    MCH 30.3 26.0 - 34.0 pg    MCHC 32.8 30.0 - 36.0 g/dL    RDW 13.2 11.5 - 15.5 %    Platelets 206 150 - 400 K/uL  Creatinine, serum     Status: None    Collection Time: 05/12/17  7:47 PM  Result Value Ref Range    Creatinine, Ser 0.65 0.44 - 1.00 mg/dL    GFR calc non Af Amer >60 >60 mL/min    GFR calc Af Amer >60 >60 mL/min  CBC     Status: None    Collection Time: 05/13/17  4:57 AM  Result Value Ref Range    WBC 7.5 4.0 - 10.5 K/uL    RBC 3.98 3.87 - 5.11 MIL/uL    Hemoglobin 12.0 12.0 - 15.0 g/dL    HCT 36.7 36.0 - 46.0 %    MCV 92.2 78.0 - 100.0 fL    MCH 30.2 26.0 - 34.0 pg    MCHC 32.7 30.0 - 36.0 g/dL    RDW 13.2 11.5 - 15.5 %    Platelets 215 150 - 400 K/uL  Basic metabolic panel      Status: Abnormal    Collection Time: 05/13/17  4:57 AM  Result Value Ref Range    Sodium 139 135 - 145 mmol/L    Potassium 4.3 3.5 - 5.1 mmol/L    Chloride 105 101 - 111 mmol/L    CO2 26 22 - 32 mmol/L    Glucose, Bld 159 (H) 65 - 99 mg/dL    BUN 5 (L) 6 - 20 mg/dL    Creatinine, Ser 0.67 0.44 - 1.00 mg/dL    Calcium 8.6 (L) 8.9 - 10.3 mg/dL    GFR calc non Af Amer >60 >60 mL/min    GFR calc Af Amer >60 >  60 mL/min    Anion gap 8 5 - 15          PHYSICAL EXAM:    Gen: Awake and alert, resting comfortably in bedside chair Lungs: Breathing is unlabored Cardiac: Regular rate and rhythm Abd: + Bowel sounds, nontender Ext:       Right lower extremity                  Dressing to right hip is clean, dry and intact.  No drainage             No swelling to right leg             Distal motor and sensory function intact             Extremity is warm             + DP pulse             Compartments are soft and nontender, no pain with passive stretching                     Assessment/Plan: 1 Day Post-Op    Active Problems:   Femoral neck fracture ([facility])               Anti-infectives     Start     Dose/Rate Route Frequency Ordered Stop    05/13/17 0600   ceFAZolin (ANCEF) IVPB 2g/100 mL premix    Comments:  500 mg test dose in OR, if no reaction will give remaining dose   2 g 200 mL/hr over 30 Minutes Intravenous On call to O.R. 05/12/17 1439 05/12/17 1625    05/12/17 2200   ceFAZolin (ANCEF) IVPB 2g/100 mL premix     2 g 200 mL/hr over 30 Minutes Intravenous Every 6 hours 05/12/17 1757 05/13/17 0507    05/12/17 1456   ceFAZolin (ANCEF) 2-4 GM/100ML-% IVPB    Comments:  Schonewitz, Leigh   : cabinet override         05/12/17 1456 05/12/17 1615     .   POD/HD#: 77   66 year old white female with closed right femoral neck fracture, valgus impacted   -Valgus impacted right femoral neck fracture s/p cannulated screw fixation             Touchdown weightbearing for 4-6 weeks              No range of motion restrictions             PT and OT             Dressing changes as needed starting on Sunday, 05/14/2017             Discharge home today   - Pain management:             Tylenol, Percocet for severe pain   - ABL anemia/Hemodynamics             Stable   - DVT/PE prophylaxis:             Patient intolerant of aspirin.  Will discharge home on 14 days of Lovenox - ID:              Perioperative antibiotics complete   - Metabolic Bone Disease:             Metabolic bone labs look fantastic.  Clinically her bone quality was very good intraoperatively   - Activity:  Activity as tolerated touchdown weightbearing right leg   - FEN/GI prophylaxis/Foley/Lines:             Regular diet   - Dispo:             Discharge home today after cleared by PT     Disposition: Home   Discharge Instructions    Call MD / Call 911    Complete by:  As directed    If you experience chest pain or shortness of breath, CALL 911 and be transported to the hospital emergency room.  If you develope a fever above 101 F, pus (white drainage) or increased drainage or redness at the wound, or calf pain, call your surgeon's office.   Constipation Prevention    Complete by:  As directed    Drink plenty of fluids.  Prune juice may be helpful.  You may use a stool softener, such as Colace (over the counter) 100 mg twice a day.  Use MiraLax (over the counter) for constipation as needed.   Diet general    Complete by:  As directed    Discharge instructions    Complete by:  As directed    Orthopaedic Trauma Service Discharge Instructions   General Discharge Instructions  WEIGHT BEARING STATUS: Touchdown weightbearing right leg  RANGE OF MOTION/ACTIVITY: Unrestricted range of motion right hip  Wound Care: Daily dressing changes starting on 05/14/2017.  Please see below  Discharge Wound Care Instructions  Do NOT apply any ointments, solutions or lotions to pin sites  or surgical wounds.  These prevent needed drainage and even though solutions like hydrogen peroxide kill bacteria, they also damage cells lining the pin sites that help fight infection.  Applying lotions or ointments can keep the wounds moist and can cause them to breakdown and open up as well. This can increase the risk for infection. When in doubt call the office.  Surgical incisions should be dressed daily.  If any drainage is noted, use one layer of adaptic, then gauze, Kerlix, and an ace wrap.  Once the incision is completely dry and without drainage, it may be left open to air out.  Showering may begin 36-48 hours later.  Cleaning gently with soap and water.  Traumatic wounds should be dressed daily as well.    One layer of adaptic, gauze, Kerlix, then ace wrap.  The adaptic can be discontinued once the draining has ceased    If you have a wet to dry dressing: wet the gauze with saline the squeeze as much saline out so the gauze is moist (not soaking wet), place moistened gauze over wound, then place a dry gauze over the moist one, followed by Kerlix wrap, then ace wrap.  DVT/PE prophylaxis: Lovenox 14 days  Diet: as you were eating previously.  Can use over the counter stool softeners and bowel preparations, such as Miralax, to help with bowel movements.  Narcotics can be constipating.  Be sure to drink plenty of fluids  PAIN MEDICATION USE AND EXPECTATIONS  You have likely been given narcotic medications to help control your pain.  After a traumatic event that results in an fracture (broken bone) with or without surgery, it is ok to use narcotic pain medications to help control one's pain.  We understand that everyone responds to pain differently and each individual patient will be evaluated on a regular basis for the continued need for narcotic medications. Ideally, narcotic medication use should last no more than 6-8 weeks (coinciding with  fracture healing).   As a patient it is your  responsibility as well to monitor narcotic medication use and report the amount and frequency you use these medications when you come to your office visit.   We would also advise that if you are using narcotic medications, you should take a dose prior to therapy to maximize you participation.  IF YOU ARE ON NARCOTIC MEDICATIONS IT IS NOT PERMISSIBLE TO OPERATE A MOTOR VEHICLE (MOTORCYCLE/CAR/TRUCK/MOPED) OR HEAVY MACHINERY DO NOT MIX NARCOTICS WITH OTHER CNS (CENTRAL NERVOUS SYSTEM) DEPRESSANTS SUCH AS ALCOHOL   STOP SMOKING OR USING NICOTINE PRODUCTS!!!!  As discussed nicotine severely impairs your body's ability to heal surgical and traumatic wounds but also impairs bone healing.  Wounds and bone heal by forming microscopic blood vessels (angiogenesis) and nicotine is a vasoconstrictor (essentially, shrinks blood vessels).  Therefore, if vasoconstriction occurs to these microscopic blood vessels they essentially disappear and are unable to deliver necessary nutrients to the healing tissue.  This is one modifiable factor that you can do to dramatically increase your chances of healing your injury.    (This means no smoking, no nicotine gum, patches, etc)  DO NOT USE NONSTEROIDAL ANTI-INFLAMMATORY DRUGS (NSAID'S)  Using products such as Advil (ibuprofen), Aleve (naproxen), Motrin (ibuprofen) for additional pain control during fracture healing can delay and/or prevent the healing response.  If you would like to take over the counter (OTC) medication, Tylenol (acetaminophen) is ok.  However, some narcotic medications that are given for pain control contain acetaminophen as well. Therefore, you should not exceed more than 4000 mg of tylenol in a day if you do not have liver disease.  Also note that there are may OTC medicines, such as cold medicines and allergy medicines that my contain tylenol as well.  If you have any questions about medications and/or interactions please ask your doctor/PA or your  pharmacist.      ICE AND ELEVATE INJURED/OPERATIVE EXTREMITY  Using ice and elevating the injured extremity above your heart can help with swelling and pain control.  Icing in a pulsatile fashion, such as 20 minutes on and 20 minutes off, can be followed.    Do not place ice directly on skin. Make sure there is a barrier between to skin and the ice pack.    Using frozen items such as frozen peas works well as the conform nicely to the are that needs to be iced.  USE AN ACE WRAP OR TED HOSE FOR SWELLING CONTROL  In addition to icing and elevation, Ace wraps or TED hose are used to help limit and resolve swelling.  It is recommended to use Ace wraps or TED hose until you are informed to stop.    When using Ace Wraps start the wrapping distally (farthest away from the body) and wrap proximally (closer to the body)   Example: If you had surgery on your leg or thing and you do not have a splint on, start the ace wrap at the toes and work your way up to the thigh        If you had surgery on your upper extremity and do not have a splint on, start the ace wrap at your fingers and work your way up to the upper arm  IF YOU ARE IN A SPLINT OR CAST DO NOT [place]   If your splint gets wet for any reason please contact the office immediately. You may shower in your splint or cast as  long as you keep it dry.  This can be done by wrapping in a cast cover or garbage back (or similar)  Do Not stick any thing down your splint or cast such as pencils, money, or hangers to try and scratch yourself with.  If you feel itchy take benadryl as prescribed on the bottle for itching  IF YOU ARE IN A CAM BOOT (BLACK BOOT)  You may remove boot periodically. Perform daily dressing changes as noted below.  Wash the liner of the boot regularly and wear a sock when wearing the boot. It is recommended that you sleep in the boot until told otherwise  CALL THE OFFICE WITH ANY QUESTIONS OR CONCERNS: [phone]    Increase activity slowly as tolerated    Complete by:  As directed    Touch down weight bearing    Complete by:  As directed    Laterality:  right   Extremity:  Lower     Allergies as of 05/13/2017      Reactions   Amoxicillin Other (See Comments)   blisters   Asa [aspirin] Nausea And Vomiting   Stomach irritation   Doxycycline Other (See Comments)   Blisters on toes      Medication List    TAKE these medications   acetaminophen 500 MG tablet Commonly known as:  TYLENOL Take 1-2 tablets (500-1,000 mg total) by mouth every 6 (six) hours as needed.   CALCIUM 600 600 MG Tabs tablet Generic drug:  calcium carbonate Take 600 mg by mouth.   docusate sodium 100 MG capsule Commonly known as:  COLACE Take 1 capsule (100 mg total) by mouth 2 (two) times daily.   enoxaparin 40 MG/0.4ML injection Commonly known as:  LOVENOX Inject 0.4 mLs (40 mg total) into the skin daily.   enoxaparin Kit Commonly known as:  LOVENOX 1 kit by Does not apply route once.   estradiol 0.5 MG tablet Commonly known as:  ESTRACE Take 0.5 mg by mouth daily.   loratadine 10 MG tablet Commonly known as:  CLARITIN Take 10 mg by mouth daily.   methocarbamol 500 MG tablet Commonly known as:  ROBAXIN Take 1-2 tablets (500-1,000 mg total) by mouth every 6 (six) hours as needed for muscle spasms.   Multiple Vitamins tablet Take 1 tablet by mouth daily.   oxyCODONE-acetaminophen 5-325 MG tablet Commonly known as:  ROXICET Take 1-2 tablets by mouth every 6 (six) hours as needed.   pantoprazole 20 MG tablet Commonly known as:  PROTONIX Take 1 tablet (20 mg total) by mouth daily.   VAGIFEM 10 MCG Tabs vaginal tablet Generic drug:  Estradiol Take 10 mcg by mouth every Monday, Wednesday, and Friday.   VITAMIN D-1000 MAX ST 1000 units tablet Generic drug:  Cholecalciferol Take 1,000 Units by mouth daily.            Discharge Care Instructions        Start     Ordered   05/13/17 0000   Touch down weight bearing    Question Answer Comment  Laterality right   Extremity Lower      05/13/17 1222     Follow-up Information    Altamese [place], MD. Schedule an appointment as soon as possible for a visit in 10 day(s).   Specialty:  Orthopedic Surgery Contact information: [facility] [address] [place] [address] [phone]           Discharge Instructions and Plan:  66 year old white female with  closed right femoral neck fracture, valgus impacted   -Valgus impacted right femoral neck fracture s/p cannulated screw fixation             Touchdown weightbearing for 4-6 weeks             No range of motion restrictions             PT and OT             Dressing changes as needed starting on Sunday, 05/14/2017             Discharge home today   - Pain management:             Tylenol, Percocet for severe pain   - ABL anemia/Hemodynamics             Stable   - DVT/PE prophylaxis:             Patient intolerant of aspirin.  Will discharge home on 14 days of Lovenox - ID:              Perioperative antibiotics complete   - Metabolic Bone Disease:             Metabolic bone labs look fantastic.  Clinically her bone quality was very good intraoperatively   - Activity:             Activity as tolerated touchdown weightbearing right leg   - FEN/GI prophylaxis/Foley/Lines:             Regular diet   - Dispo:            Follow-up with orthopedics in 10-14 days     Signed:  Jari Pigg, PA-C [facility] [phone] (P) 05/13/2017, 12:23 PM

## 2017-05-13 NOTE — Discharge Instructions (Signed)
Orthopaedic Trauma Service Discharge Instructions   General Discharge Instructions  WEIGHT BEARING STATUS: Touchdown weightbearing right leg  RANGE OF MOTION/ACTIVITY: Unrestricted range of motion right hip  Wound Care: Daily dressing changes starting on 05/14/2017.  Please see below  Discharge Wound Care Instructions  Do NOT apply any ointments, solutions or lotions to pin sites or surgical wounds.  These prevent needed drainage and even though solutions like hydrogen peroxide kill bacteria, they also damage cells lining the pin sites that help fight infection.  Applying lotions or ointments can keep the wounds moist and can cause them to breakdown and open up as well. This can increase the risk for infection. When in doubt call the office.  Surgical incisions should be dressed daily.  If any drainage is noted, use one layer of adaptic, then gauze, Kerlix, and an ace wrap.  Once the incision is completely dry and without drainage, it may be left open to air out.  Showering may begin 36-48 hours later.  Cleaning gently with soap and water.  Traumatic wounds should be dressed daily as well.    One layer of adaptic, gauze, Kerlix, then ace wrap.  The adaptic can be discontinued once the draining has ceased    If you have a wet to dry dressing: wet the gauze with saline the squeeze as much saline out so the gauze is moist (not soaking wet), place moistened gauze over wound, then place a dry gauze over the moist one, followed by Kerlix wrap, then ace wrap.  DVT/PE prophylaxis: Lovenox 14 days  Diet: as you were eating previously.  Can use over the counter stool softeners and bowel preparations, such as Miralax, to help with bowel movements.  Narcotics can be constipating.  Be sure to drink plenty of fluids  PAIN MEDICATION USE AND EXPECTATIONS  You have likely been given narcotic medications to help control your pain.  After a traumatic event that results in an fracture (broken bone) with  or without surgery, it is ok to use narcotic pain medications to help control one's pain.  We understand that everyone responds to pain differently and each individual patient will be evaluated on a regular basis for the continued need for narcotic medications. Ideally, narcotic medication use should last no more than 6-8 weeks (coinciding with fracture healing).   As a patient it is your responsibility as well to monitor narcotic medication use and report the amount and frequency you use these medications when you come to your office visit.   We would also advise that if you are using narcotic medications, you should take a dose prior to therapy to maximize you participation.  IF YOU ARE ON NARCOTIC MEDICATIONS IT IS NOT PERMISSIBLE TO OPERATE A MOTOR VEHICLE (MOTORCYCLE/CAR/TRUCK/MOPED) OR HEAVY MACHINERY DO NOT MIX NARCOTICS WITH OTHER CNS (CENTRAL NERVOUS SYSTEM) DEPRESSANTS SUCH AS ALCOHOL   STOP SMOKING OR USING NICOTINE PRODUCTS!!!!  As discussed nicotine severely impairs your body's ability to heal surgical and traumatic wounds but also impairs bone healing.  Wounds and bone heal by forming microscopic blood vessels (angiogenesis) and nicotine is a vasoconstrictor (essentially, shrinks blood vessels).  Therefore, if vasoconstriction occurs to these microscopic blood vessels they essentially disappear and are unable to deliver necessary nutrients to the healing tissue.  This is one modifiable factor that you can do to dramatically increase your chances of healing your injury.    (This means no smoking, no nicotine gum, patches, etc)  DO NOT USE NONSTEROIDAL ANTI-INFLAMMATORY DRUGS (NSAID'S)  Using products such as Advil (ibuprofen), Aleve (naproxen), Motrin (ibuprofen) for additional pain control during fracture healing can delay and/or prevent the healing response.  If you would like to take over the counter (OTC) medication, Tylenol (acetaminophen) is ok.  However, some narcotic medications  that are given for pain control contain acetaminophen as well. Therefore, you should not exceed more than 4000 mg of tylenol in a day if you do not have liver disease.  Also note that there are may OTC medicines, such as cold medicines and allergy medicines that my contain tylenol as well.  If you have any questions about medications and/or interactions please ask your doctor/PA or your pharmacist.      ICE AND ELEVATE INJURED/OPERATIVE EXTREMITY  Using ice and elevating the injured extremity above your heart can help with swelling and pain control.  Icing in a pulsatile fashion, such as 20 minutes on and 20 minutes off, can be followed.    Do not place ice directly on skin. Make sure there is a barrier between to skin and the ice pack.    Using frozen items such as frozen peas works well as the conform nicely to the are that needs to be iced.  USE AN ACE WRAP OR TED HOSE FOR SWELLING CONTROL  In addition to icing and elevation, Ace wraps or TED hose are used to help limit and resolve swelling.  It is recommended to use Ace wraps or TED hose until you are informed to stop.    When using Ace Wraps start the wrapping distally (farthest away from the body) and wrap proximally (closer to the body)   Example: If you had surgery on your leg or thing and you do not have a splint on, start the ace wrap at the toes and work your way up to the thigh        If you had surgery on your upper extremity and do not have a splint on, start the ace wrap at your fingers and work your way up to the upper arm  IF YOU ARE IN A SPLINT OR CAST DO NOT [place]   If your splint gets wet for any reason please contact the office immediately. You may shower in your splint or cast as long as you keep it dry.  This can be done by wrapping in a cast cover or garbage back (or similar)  Do Not stick any thing down your splint or cast such as pencils, money, or hangers to try and scratch yourself with.  If you feel  itchy take benadryl as prescribed on the bottle for itching  IF YOU ARE IN A CAM BOOT (BLACK BOOT)  You may remove boot periodically. Perform daily dressing changes as noted below.  Wash the liner of the boot regularly and wear a sock when wearing the boot. It is recommended that you sleep in the boot until told otherwise  CALL THE OFFICE WITH ANY QUESTIONS OR CONCERNS: [phone]

## 2017-06-23 DIAGNOSIS — J301 Allergic rhinitis due to pollen: Secondary | ICD-10-CM | POA: Diagnosis not present

## 2017-06-24 ENCOUNTER — Encounter: Payer: Self-pay | Admitting: *Deleted

## 2017-06-24 DIAGNOSIS — J3089 Other allergic rhinitis: Secondary | ICD-10-CM | POA: Diagnosis not present

## 2017-06-24 NOTE — Progress Notes (Unsigned)
Maintenance vial made. Exp: 06-24-18. hv

## 2017-06-29 ENCOUNTER — Encounter: Payer: Self-pay | Admitting: *Deleted

## 2017-07-06 NOTE — Progress Notes (Signed)
Immunotherapy   Patient Details  Name: Tamkia Temples MRN: [id number] Date of Birth: 1951/06/20  06/29/2017  [name] mailed  Red vials Pollen-Dog & Mold-Mite. Following schedule: B  Frequency:1 time per week Epi-Pen:Epi-Pen Available  Consent signed and patient instructions given. Patient receives injections at [facility] @ [address], [place] [address]    Heather L Vernon 06/29/2017, 9:08 AM

## 2017-07-08 ENCOUNTER — Ambulatory Visit: Payer: Medicare Other | Admitting: Gastroenterology

## 2017-07-19 HISTORY — PX: COLONOSCOPY WITH PROPOFOL: SHX5780

## 2017-08-17 ENCOUNTER — Encounter (INDEPENDENT_AMBULATORY_CARE_PROVIDER_SITE_OTHER): Payer: Self-pay

## 2017-08-17 ENCOUNTER — Encounter: Payer: Self-pay | Admitting: Gastroenterology

## 2017-08-17 ENCOUNTER — Other Ambulatory Visit (INDEPENDENT_AMBULATORY_CARE_PROVIDER_SITE_OTHER): Payer: Medicare Other

## 2017-08-17 ENCOUNTER — Ambulatory Visit: Payer: Medicare Other | Admitting: Gastroenterology

## 2017-08-17 VITALS — BP 108/70 | HR 60 | Ht 69.0 in | Wt 140.0 lb

## 2017-08-17 DIAGNOSIS — R140 Abdominal distension (gaseous): Secondary | ICD-10-CM

## 2017-08-17 DIAGNOSIS — R1013 Epigastric pain: Secondary | ICD-10-CM

## 2017-08-17 DIAGNOSIS — G8929 Other chronic pain: Secondary | ICD-10-CM

## 2017-08-17 DIAGNOSIS — Z86010 Personal history of colonic polyps: Secondary | ICD-10-CM | POA: Diagnosis not present

## 2017-08-17 LAB — IGA: IGA: 169 mg/dL (ref 68–378)

## 2017-08-17 MED ORDER — NA SULFATE-K SULFATE-MG SULF 17.5-3.13-1.6 GM/177ML PO SOLN: 1.0000 | Freq: Once | ORAL | 0 refills | Status: AC
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
## 2017-08-17 NOTE — Patient Instructions (Addendum)
If you are age 66 or older, your body mass index should be between 23-30. Your Body mass index is 20.67 kg/m. If this is out of the aforementioned range listed, please consider follow up with your Primary Care Provider.  If you are age 66 or younger, your body mass index should be between 19-25. Your Body mass index is 20.67 kg/m. If this is out of the aformentioned range listed, please consider follow up with your Primary Care Provider.   You have been scheduled for a colonoscopy. Please follow written instructions given to you at your visit today.  Please pick up your prep supplies at the pharmacy within the next 1-3 days. If you use inhalers (even only as needed), please bring them with you on the day of your procedure. Your physician has requested that you go to www.startemmi.com and enter the access code given to you at your visit today. This web site gives a general overview about your procedure. However, you should still follow specific instructions given to you by our office regarding your preparation for the procedure.   Please go to the lab in the basement of our building to have lab work done as you leave today.  Per our discussion please wean off of your Protonix.  Resume as needed.  Thank you for entrusting me with your care and for choosing [facility], Dr.  Cellar

## 2017-08-18 LAB — TISSUE TRANSGLUTAMINASE ABS,IGG,IGA
(TTG) AB, IGA: 1 U/mL
(TTG) AB, IGG: 2 U/mL

## 2017-08-23 ENCOUNTER — Other Ambulatory Visit: Payer: Self-pay | Admitting: Internal Medicine

## 2017-08-23 DIAGNOSIS — Z139 Encounter for screening, unspecified: Secondary | ICD-10-CM

## 2017-08-25 NOTE — Progress Notes (Signed)
VIALS EXP 08-26-18

## 2017-08-31 DIAGNOSIS — J301 Allergic rhinitis due to pollen: Secondary | ICD-10-CM | POA: Diagnosis not present

## 2017-09-01 DIAGNOSIS — J3089 Other allergic rhinitis: Secondary | ICD-10-CM | POA: Diagnosis not present

## 2017-09-14 ENCOUNTER — Ambulatory Visit
Admission: RE | Admit: 2017-09-14 | Discharge: 2017-09-14 | Disposition: A | Discharge status: Home / Self Care | Payer: Medicare Other | Source: Ambulatory Visit | Attending: Internal Medicine | Admitting: Internal Medicine

## 2017-09-14 DIAGNOSIS — Z139 Encounter for screening, unspecified: Secondary | ICD-10-CM

## 2017-09-19 ENCOUNTER — Encounter: Payer: Self-pay | Admitting: *Deleted

## 2017-09-19 NOTE — Progress Notes (Signed)
Immunotherapy   Patient Details  Name: Briele Lagasse MRN: [id number] Date of Birth: 01-Dec-1950  09/19/2017  Vincenza Hews Red vials Pollen-Dog &Mold-Mite. Following schedule:C Frequency:1 time per week @ 0.50 continue every 2 weeks.  Epi-Pen:Epi-Pen Available Consent signed and patient instructions given.Patient receives injections at [facility] @ [address] [place] [address]   Heather L Vernon 09/19/2017, 9:59 AM

## 2017-09-29 ENCOUNTER — Encounter: Payer: Self-pay | Admitting: Gastroenterology

## 2017-09-29 ENCOUNTER — Other Ambulatory Visit: Payer: Self-pay

## 2017-09-29 ENCOUNTER — Ambulatory Visit (AMBULATORY_SURGERY_CENTER): Payer: Medicare Other | Admitting: Gastroenterology

## 2017-09-29 VITALS — BP 107/62 | HR 56 | Temp 97.1°F | Resp 12 | Ht 69.0 in | Wt 140.0 lb

## 2017-09-29 DIAGNOSIS — Z86010 Personal history of colonic polyps: Secondary | ICD-10-CM | POA: Diagnosis not present

## 2017-09-29 DIAGNOSIS — D120 Benign neoplasm of cecum: Secondary | ICD-10-CM | POA: Diagnosis not present

## 2017-09-29 DIAGNOSIS — D122 Benign neoplasm of ascending colon: Secondary | ICD-10-CM

## 2017-09-29 DIAGNOSIS — D123 Benign neoplasm of transverse colon: Secondary | ICD-10-CM

## 2017-09-29 MED ORDER — SODIUM CHLORIDE 0.9 % IV SOLN: 500.0000 mL | Freq: Once | INTRAVENOUS | Status: DC

## 2017-09-29 NOTE — Progress Notes (Signed)
Pt has several blistered areas to back d/t recent dermatologist visit

## 2017-09-29 NOTE — Progress Notes (Signed)
Called to room to assist during endoscopic procedure.  Patient ID and intended procedure confirmed with present staff. Received instructions for my participation in the procedure from the performing physician.  

## 2017-09-29 NOTE — Patient Instructions (Signed)
   INFORMATION ON POLYPS AND DIVERTICULOIS GIVEN TO YOU TODAY  NO ASPIRIN, ASPIRIN CONTAINING PRODUCTS (BC OR GOODY POWDERS) OR NSAIDS (IBUPROFEN, ADVIL, ALEVE, AND MOTRIN) FOR 2 WEEKS AFTER POYP REMOVAL; TYLENOL IS OK TO TAKE    YOU HAD AN ENDOSCOPIC PROCEDURE TODAY AT THE [facility]:   Refer to the procedure report that was given to you for any specific questions about what was found during the examination.  If the procedure report does not answer your questions, please call your gastroenterologist to clarify.  If you requested that your care partner not be given the details of your procedure findings, then the procedure report has been included in a sealed envelope for you to review at your convenience later.  YOU SHOULD EXPECT: Some feelings of bloating in the abdomen. Passage of more gas than usual.  Walking can help get rid of the air that was put into your GI tract during the procedure and reduce the bloating. If you had a lower endoscopy (such as a colonoscopy or flexible sigmoidoscopy) you may notice spotting of blood in your stool or on the toilet paper. If you underwent a bowel prep for your procedure, you may not have a normal bowel movement for a few days.  Please Note:  You might notice some irritation and congestion in your nose or some drainage.  This is from the oxygen used during your procedure.  There is no need for concern and it should clear up in a day or so.  SYMPTOMS TO REPORT IMMEDIATELY:   Following lower endoscopy (colonoscopy or flexible sigmoidoscopy):  Excessive amounts of blood in the stool  Significant tenderness or worsening of abdominal pains  Swelling of the abdomen that is new, acute  Fever of 100F or higher    For urgent or emergent issues, a gastroenterologist can be reached at any hour by calling [phone].   DIET:  We do recommend a small meal at first, but then you may proceed to your regular diet.  Drink plenty of fluids but you  should avoid alcoholic beverages for 24 hours.  ACTIVITY:  You should plan to take it easy for the rest of today and you should NOT DRIVE or use heavy machinery until tomorrow (because of the sedation medicines used during the test).    FOLLOW UP: Our staff will call the number listed on your records the next business day following your procedure to check on you and address any questions or concerns that you may have regarding the information given to you following your procedure. If we do not reach you, we will leave a message.  However, if you are feeling well and you are not experiencing any problems, there is no need to return our call.  We will assume that you have returned to your regular daily activities without incident.  If any biopsies were taken you will be contacted by phone or by letter within the next 1-3 weeks.  Please call us at [phone] if you have not heard about the biopsies in 3 weeks.    SIGNATURES/CONFIDENTIALITY: You and/or your care partner have signed paperwork which will be entered into your electronic medical record.  These signatures attest to the fact that that the information above on your After Visit Summary has been reviewed and is understood.  Full responsibility of the confidentiality of this discharge information lies with you and/or your care-partner.

## 2017-09-29 NOTE — Op Note (Signed)
[place] Patient Name: Vale Peraza Procedure Date: 09/29/2017 7:44 AM MRN: [id number] Endoscopist: Remo Lipps P. Bradd Merlos MD, MD Age: 67 Referring MD:  Date of Birth: 06-10-1951 Gender: Female Account #: [id number] Procedure:                Colonoscopy Indications:              Surveillance: Personal history of adenomatous                            polyps on last colonoscopy 5 years ago Medicines:                Monitored Anesthesia Care Procedure:                Pre-Anesthesia Assessment:                           - Prior to the procedure, a History and Physical                            was performed, and patient medications and                            allergies were reviewed. The patient's tolerance of                            previous anesthesia was also reviewed. The risks                            and benefits of the procedure and the sedation                            options and risks were discussed with the patient.                            All questions were answered, and informed consent                            was obtained. Prior Anticoagulants: The patient has                            taken no previous anticoagulant or antiplatelet                            agents. ASA Grade Assessment: II - A patient with                            mild systemic disease. After reviewing the risks                            and benefits, the patient was deemed in                            satisfactory condition to undergo the procedure.  After obtaining informed consent, the colonoscope                            was passed under direct vision. Throughout the                            procedure, the patient's blood pressure, pulse, and                            oxygen saturations were monitored continuously. The                            Colonoscope was introduced through the anus and                            advanced to the the  cecum, identified by                            appendiceal orifice and ileocecal valve. The                            colonoscopy was performed without difficulty. The                            patient tolerated the procedure well. The quality                            of the bowel preparation was good. The ileocecal                            valve, appendiceal orifice, and rectum were                            photographed. Scope In: 7:55:18 AM Scope Out: 8:15:08 AM Scope Withdrawal Time: 0 hours 16 minutes 52 seconds  Total Procedure Duration: 0 hours 19 minutes 50 seconds  Findings:                 The perianal and digital rectal examinations were                            normal.                           A 10 mm polyp was found in the cecum on the back                            side of the IC valve. The polyp was flat. The polyp                            was removed with a cold snare. Resection and                            retrieval were complete.  A 5 mm polyp was found in the ascending colon. The                            polyp was flat. The polyp was removed with a cold                            snare. Resection and retrieval were complete.                           A 4 mm polyp was found in the transverse colon. The                            polyp was sessile. The polyp was removed with a                            cold snare. Resection and retrieval were complete.                           The colon was tortuous.                           A few small-mouthed diverticula were found in the                            sigmoid colon.                           The exam was otherwise without abnormality. Complications:            No immediate complications. Estimated blood loss:                            Minimal. Estimated Blood Loss:     Estimated blood loss was minimal. Impression:               - One 10 mm polyp in the cecum, removed with a  cold                            snare. Resected and retrieved.                           - One 5 mm polyp in the ascending colon, removed                            with a cold snare. Resected and retrieved.                           - One 4 mm polyp in the transverse colon, removed                            with a cold snare. Resected and retrieved.                           - Tortuous colon.                           -  Diverticulosis in the sigmoid colon.                           - The examination was otherwise normal. Recommendation:           - Patient has a contact number available for                            emergencies. The signs and symptoms of potential                            delayed complications were discussed with the                            patient. Return to normal activities tomorrow.                            Written discharge instructions were provided to the                            patient.                           - Resume previous diet.                           - Continue present medications.                           - Await pathology results.                           - Repeat colonoscopy is recommended for                            surveillance. The colonoscopy date will be                            determined after pathology results from today's                            exam become available for review.                           - No ibuprofen, naproxen, or other non-steroidal                            anti-inflammatory drugs for 2 weeks after polyp                            removal. Remo Lipps P. Zymeir Salminen MD, MD 09/29/2017 8:21:02 AM This report has been signed electronically.

## 2017-09-29 NOTE — Progress Notes (Signed)
Report to PACU, RN, vss, BBS= Clear.  

## 2017-09-30 ENCOUNTER — Telehealth: Payer: Self-pay

## 2017-09-30 NOTE — Telephone Encounter (Signed)
  Follow up Call-  Call back number 09/29/2017  Post procedure Call Back phone  # [phone]  Permission to leave phone message Yes  Some recent data might be hidden     Patient questions:  Do you have a fever, pain , or abdominal swelling? No. Pain Score  0 *  Have you tolerated food without any problems? Yes.    Have you been able to return to your normal activities? Yes.    Do you have any questions about your discharge instructions: Diet   No. Medications  No. Follow up visit  No.  Do you have questions or concerns about your Care? No.  Actions: * If pain score is 4 or above: No action needed, pain <4.

## 2017-10-11 ENCOUNTER — Encounter: Payer: Self-pay | Admitting: Gastroenterology

## 2017-11-22 ENCOUNTER — Other Ambulatory Visit: Payer: Self-pay | Admitting: Orthopedic Surgery

## 2017-11-22 DIAGNOSIS — M25551 Pain in right hip: Secondary | ICD-10-CM

## 2017-11-23 ENCOUNTER — Ambulatory Visit
Admission: RE | Admit: 2017-11-23 | Discharge: 2017-11-23 | Disposition: A | Discharge status: Home / Self Care | Payer: Medicare Other | Source: Ambulatory Visit | Attending: Orthopedic Surgery | Admitting: Orthopedic Surgery

## 2017-11-23 DIAGNOSIS — M25551 Pain in right hip: Secondary | ICD-10-CM

## 2017-12-09 NOTE — Progress Notes (Signed)
Preop on 5/29.  Need orders in epic.

## 2017-12-13 NOTE — Patient Instructions (Addendum)
Kaitlyn Spencer  12/13/2017   Your procedure is scheduled on: Monday 12/19/2017  Report to [facility] Main  Entrance   Report to admitting at   1200 PM    Call this number if you have problems the morning of surgery [phone]    Remember: Do not eat food :After Midnight.  May have clear liquids from midnight up until 0830 am then nothing until after surgery!      CLEAR LIQUID DIET   Foods Allowed                                                                     Foods Excluded  Coffee and tea, regular and decaf                             liquids that you cannot  Plain Jell-O in any flavor                                             see through such as: Fruit ices (not with fruit pulp)                                     milk, soups, orange juice  Iced Popsicles                                    All solid food Carbonated beverages, regular and diet                                    Cranberry, grape and apple juices Sports drinks like Gatorade Lightly seasoned clear broth or consume(fat free) Sugar, honey syrup  Sample Menu Breakfast                                Lunch                                     Supper Cranberry juice                    Beef broth                            Chicken broth Jell-O                                     Grape juice                           Apple juice  Coffee or tea                        Jell-O                                      Popsicle                                                Coffee or tea                        Coffee or tea  _____________________________________________________________________     Take these medicines the morning of surgery with A SIP OF WATER:  Certirizine (Zyrtec)                                You may not have any metal on your body including hair pins and              piercings  Do not wear jewelry, make-up, lotions, powders or perfumes, deodorant             Do not wear  nail polish.  Do not shave  48 hours prior to surgery.               Do not bring valuables to the hospital. [place].  Contacts, dentures or bridgework may not be worn into surgery.  Leave suitcase in the car. After surgery it may be brought to your room.                  Please read over the following fact sheets you were given: _____________________________________________________________________             [facility] - Preparing for Surgery Before surgery, you can play an important role.  Because skin is not sterile, your skin needs to be as free of germs as possible.  You can reduce the number of germs on your skin by washing with CHG (chlorahexidine gluconate) soap before surgery.  CHG is an antiseptic cleaner which kills germs and bonds with the skin to continue killing germs even after washing. Please DO NOT use if you have an allergy to CHG or antibacterial soaps.  If your skin becomes reddened/irritated stop using the CHG and inform your nurse when you arrive at Short Stay. Do not shave (including legs and underarms) for at least 48 hours prior to the first CHG shower.  You may shave your face/neck. Please follow these instructions carefully:  1.  Shower with CHG Soap the night before surgery and the  morning of Surgery.  2.  If you choose to wash your hair, wash your hair first as usual with your  normal  shampoo.  3.  After you shampoo, rinse your hair and body thoroughly to remove the  shampoo.                           4.  Use CHG as you would any other liquid soap.  You can apply chg  directly  to the skin and wash                       Gently with a scrungie or clean washcloth.  5.  Apply the CHG Soap to your body ONLY FROM THE NECK DOWN.   Do not use on face/ open                           Wound or open sores. Avoid contact with eyes, ears mouth and genitals (private parts).                       Wash face,  Genitals  (private parts) with your normal soap.             6.  Wash thoroughly, paying special attention to the area where your surgery  will be performed.  7.  Thoroughly rinse your body with warm water from the neck down.  8.  DO NOT shower/wash with your normal soap after using and rinsing off  the CHG Soap.                9.  Pat yourself dry with a clean towel.            10.  Wear clean pajamas.            11.  Place clean sheets on your bed the night of your first shower and do not  sleep with pets. Day of Surgery : Do not apply any lotions/deodorants the morning of surgery.  Please wear clean clothes to the hospital/surgery center.  FAILURE TO FOLLOW THESE INSTRUCTIONS MAY RESULT IN THE CANCELLATION OF YOUR SURGERY PATIENT SIGNATURE_________________________________  NURSE SIGNATURE__________________________________  ________________________________________________________________________   Adam Phenix  An incentive spirometer is a tool that can help keep your lungs clear and active. This tool measures how well you are filling your lungs with each breath. Taking long deep breaths may help reverse or decrease the chance of developing breathing (pulmonary) problems (especially infection) following:  A long period of time when you are unable to move or be active. BEFORE THE PROCEDURE   If the spirometer includes an indicator to show your best effort, your nurse or respiratory therapist will set it to a desired goal.  If possible, sit up straight or lean slightly forward. Try not to slouch.  Hold the incentive spirometer in an upright position. INSTRUCTIONS FOR USE  1. Sit on the edge of your bed if possible, or sit up as far as you can in bed or on a chair. 2. Hold the incentive spirometer in an upright position. 3. Breathe out normally. 4. Place the mouthpiece in your mouth and seal your lips tightly around it. 5. Breathe in slowly and as deeply as possible, raising the  piston or the ball toward the top of the column. 6. Hold your breath for 3-5 seconds or for as long as possible. Allow the piston or ball to fall to the bottom of the column. 7. Remove the mouthpiece from your mouth and breathe out normally. 8. Rest for a few seconds and repeat Steps 1 through 7 at least 10 times every 1-2 hours when you are awake. Take your time and take a few normal breaths between deep breaths. 9. The spirometer may include an indicator to show your best effort. Use the indicator as a goal to work toward during each repetition.  10. After each set of 10 deep breaths, practice coughing to be sure your lungs are clear. If you have an incision (the cut made at the time of surgery), support your incision when coughing by placing a pillow or rolled up towels firmly against it. Once you are able to get out of bed, walk around indoors and cough well. You may stop using the incentive spirometer when instructed by your caregiver.  RISKS AND COMPLICATIONS  Take your time so you do not get dizzy or light-headed.  If you are in pain, you may need to take or ask for pain medication before doing incentive spirometry. It is harder to take a deep breath if you are having pain. AFTER USE  Rest and breathe slowly and easily.  It can be helpful to keep track of a log of your progress. Your caregiver can provide you with a simple table to help with this. If you are using the spirometer at home, follow these instructions: [place] IF:   You are having difficultly using the spirometer.  You have trouble using the spirometer as often as instructed.  Your pain medication is not giving enough relief while using the spirometer.  You develop fever of 100.5 F (38.1 C) or higher. SEEK IMMEDIATE MEDICAL CARE IF:   You cough up bloody sputum that had not been present before.  You develop fever of 102 F (38.9 C) or greater.  You develop worsening pain at or near the incision  site. MAKE SURE YOU:   Understand these instructions.  Will watch your condition.  Will get help right away if you are not doing well or get worse. Document Released: 11/15/2006 Document Revised: 09/27/2011 Document Reviewed: 01/16/2007 ExitCare Patient Information 2014 Exit[employer], [place].   ________________________________________________________________________  WHAT IS A BLOOD TRANSFUSION? Blood Transfusion Information  A transfusion is the replacement of blood or some of its parts. Blood is made up of multiple cells which provide different functions.  Red blood cells carry oxygen and are used for blood loss replacement.  White blood cells fight against infection.  Platelets control bleeding.  Plasma helps clot blood.  Other blood products are available for specialized needs, such as hemophilia or other clotting disorders. BEFORE THE TRANSFUSION  Who gives blood for transfusions?   Healthy volunteers who are fully evaluated to make sure their blood is safe. This is blood bank blood. Transfusion therapy is the safest it has ever been in the practice of medicine. Before blood is taken from a donor, a complete history is taken to make sure that person has no history of diseases nor engages in risky social behavior (examples are intravenous drug use or sexual activity with multiple partners). The donor's travel history is screened to minimize risk of transmitting infections, such as malaria. The donated blood is tested for signs of infectious diseases, such as HIV and hepatitis. The blood is then tested to be sure it is compatible with you in order to minimize the chance of a transfusion reaction. If you or a relative donates blood, this is often done in anticipation of surgery and is not appropriate for emergency situations. It takes many days to process the donated blood. RISKS AND COMPLICATIONS Although transfusion therapy is very safe and saves many lives, the main dangers of  transfusion include:   Getting an infectious disease.  Developing a transfusion reaction. This is an allergic reaction to something in the blood you were given. Every precaution is taken to prevent this. The  decision to have a blood transfusion has been considered carefully by your caregiver before blood is given. Blood is not given unless the benefits outweigh the risks. AFTER THE TRANSFUSION  Right after receiving a blood transfusion, you will usually feel much better and more energetic. This is especially true if your red blood cells have gotten low (anemic). The transfusion raises the level of the red blood cells which carry oxygen, and this usually causes an energy increase.  The nurse administering the transfusion will monitor you carefully for complications. HOME CARE INSTRUCTIONS  No special instructions are needed after a transfusion. You may find your energy is better. Speak with your caregiver about any limitations on activity for underlying diseases you may have. SEEK MEDICAL CARE IF:   Your condition is not improving after your transfusion.  You develop redness or irritation at the intravenous (IV) site. SEEK IMMEDIATE MEDICAL CARE IF:  Any of the following symptoms occur over the next 12 hours:  Shaking chills.  You have a temperature by mouth above 102 F (38.9 C), not controlled by medicine.  Chest, back, or muscle pain.  People around you feel you are not acting correctly or are confused.  Shortness of breath or difficulty breathing.  Dizziness and fainting.  You get a rash or develop hives.  You have a decrease in urine output.  Your urine turns a dark color or changes to pink, red, or brown. Any of the following symptoms occur over the next 10 days:  You have a temperature by mouth above 102 F (38.9 C), not controlled by medicine.  Shortness of breath.  Weakness after normal activity.  The white part of the eye turns yellow (jaundice).  You have a  decrease in the amount of urine or are urinating less often.  Your urine turns a dark color or changes to pink, red, or brown. Document Released: 07/02/2000 Document Revised: 09/27/2011 Document Reviewed: 02/19/2008 [facility] Patient Information 2014 [place], [place].  _______________________________________________________________________

## 2017-12-13 NOTE — Progress Notes (Signed)
05/12/2017- noted in Epic- EKG and CXR 1 view

## 2017-12-14 ENCOUNTER — Encounter (HOSPITAL_COMMUNITY)
Admission: RE | Admit: 2017-12-14 | Discharge: 2017-12-14 | Disposition: A | Discharge status: Home / Self Care | Payer: Medicare Other | Source: Ambulatory Visit | Attending: Orthopedic Surgery | Admitting: Orthopedic Surgery

## 2017-12-14 ENCOUNTER — Encounter (HOSPITAL_COMMUNITY): Payer: Self-pay | Admitting: *Deleted

## 2017-12-14 ENCOUNTER — Other Ambulatory Visit: Payer: Self-pay

## 2017-12-14 DIAGNOSIS — Z01812 Encounter for preprocedural laboratory examination: Secondary | ICD-10-CM | POA: Insufficient documentation

## 2017-12-14 DIAGNOSIS — M87851 Other osteonecrosis, right femur: Secondary | ICD-10-CM | POA: Insufficient documentation

## 2017-12-14 HISTORY — DX: Other specified postprocedural states: Z98.890

## 2017-12-14 HISTORY — DX: Other specified postprocedural states: R11.2

## 2017-12-14 HISTORY — DX: Other complications of anesthesia, initial encounter: T88.59XA

## 2017-12-14 HISTORY — DX: Adverse effect of unspecified anesthetic, initial encounter: T41.45XA

## 2017-12-14 HISTORY — DX: Unspecified osteoarthritis, unspecified site: M19.90

## 2017-12-14 LAB — CBC
HCT: 39.2 % (ref 36.0–46.0)
Hemoglobin: 13 g/dL (ref 12.0–15.0)
MCH: 30.9 pg (ref 26.0–34.0)
MCHC: 33.2 g/dL (ref 30.0–36.0)
MCV: 93.1 fL (ref 78.0–100.0)
Platelets: 267 10*3/uL (ref 150–400)
RBC: 4.21 MIL/uL (ref 3.87–5.11)
RDW: 13.1 % (ref 11.5–15.5)
WBC: 5.9 10*3/uL (ref 4.0–10.5)

## 2017-12-14 LAB — SURGICAL PCR SCREEN
MRSA, PCR: NEGATIVE
STAPHYLOCOCCUS AUREUS: POSITIVE — AB

## 2017-12-14 NOTE — Progress Notes (Signed)
Chart left with Zelphia Cairo, RN to follow up on labs- PCR screen and Type and Screen.

## 2017-12-15 LAB — ABO/RH: ABO/RH(D): O POS

## 2017-12-18 MED ORDER — SODIUM CHLORIDE 0.9 % IV SOLN
1000.0000 mg | INTRAVENOUS | Status: AC
  Administered 2017-12-19: 1000 mg via INTRAVENOUS
  Filled 2017-12-18: qty 1100

## 2017-12-18 MED ORDER — GENTAMICIN SULFATE 40 MG/ML IJ SOLN
300.0000 mg | INTRAVENOUS | Status: AC
  Administered 2017-12-19: 300 mg via INTRAVENOUS
  Filled 2017-12-18 (×2): qty 7.5

## 2017-12-18 NOTE — H&P (Signed)
TOTAL HIP ADMISSION H&P  Patient is admitted for right total hip arthroplasty, anterior approach.  Subjective:  Chief Complaint:   Right hip AVN / pain  HPI: Kaitlyn Spencer, 67 y.o. female, has a history of pain and functional disability in the right hip(s) due to arthritis and patient has failed non-surgical conservative treatments for greater than 12 weeks to include NSAID's and/or analgesics and activity modification.  Onset of symptoms was abrupt starting October 2018 with rapidlly worsening course since that time.The patient noted prior procedures of the hip to include ORIF on the right hip(s).  Patient currently rates pain in the right hip at 7 out of 10 with activity. Patient has night pain, worsening of pain with activity and weight bearing, trendelenberg gait, pain that interfers with activities of daily living and pain with passive range of motion. Patient has evidence of periarticular osteophytes, joint space narrowing and AVN by imaging studies. This condition presents safety issues increasing the risk of falls.  There is no current active infection.  Risks, benefits and expectations were discussed with the patient.  Risks including but not limited to the risk of anesthesia, blood clots, nerve damage, blood vessel damage, failure of the prosthesis, infection and up to and including death.  Patient understand the risks, benefits and expectations and wishes to proceed with surgery.   PCP: Prince Solian, MD  D/C Plans:       Home   Post-op Meds:       No Rx given   Tranexamic Acid:      To be given - IV   Decadron:      Is to be given  FYI:     Xarelto (GI issues)  Norco  DME:   Pt already has equipment   PT:   OPPT Rx given   Patient Active Problem List   Diagnosis Date Noted  . Femoral neck fracture ([place]) 05/12/2017  . Chronic nonseasonal allergic rhinitis due to fungal spores 06/03/2016   Past Medical History:  Diagnosis Date  . Allergy   . Arthritis   . Blood  transfusion without reported diagnosis   . Complication of anesthesia   . History of colon polyps 2014  . PONV (postoperative nausea and vomiting)   . Seasonal allergies     Past Surgical History:  Procedure Laterality Date  . ABDOMINAL HYSTERECTOMY  1998  . BREAST REDUCTION SURGERY  2005  . COLONOSCOPY    . HIP PINNING,CANNULATED Right 05/12/2017   Procedure: RIGHT CANNULATED HIP PINNING;  Surgeon: Altamese Shiloh, MD;  Location: [facility];  Service: Orthopedics;  Laterality: Right;  . REDUCTION MAMMAPLASTY Bilateral   . SPINAL FUSION  1985    Current Facility-Administered Medications  Medication Dose Route Frequency Provider Last Rate Last Dose  . 0.9 %  sodium chloride infusion  500 mL Intravenous Once Yetta Flock, MD      . Derrill Memo ON 12/19/2017] gentamicin (GARAMYCIN) 300 mg in dextrose 5 % 100 mL IVPB  300 mg Intravenous 30 min Pre-Op Berton Mount, RPH      . [START ON 12/19/2017] tranexamic acid (CYKLOKAPRON) 1,000 mg in sodium chloride 0.9 % 100 mL IVPB  1,000 mg Intravenous To OR Berton Mount, RPH       Current Outpatient Medications  Medication Sig Dispense Refill Last Dose  . b complex vitamins tablet Take 1 tablet by mouth daily.     . calcium carbonate (CALCIUM 600) 600 MG TABS tablet Take 600 mg by  mouth daily.    09/28/2017  . cetirizine (ZYRTEC) 10 MG tablet Take 10 mg by mouth daily.     . Cholecalciferol (VITAMIN D-1000 MAX ST) 1000 units tablet Take 2,000 Units by mouth daily.    09/28/2017  . estradiol (ESTRACE) 0.5 MG tablet Take 0.5 mg by mouth daily.   09/28/2017  . VAGIFEM 10 MCG TABS vaginal tablet Take 10 mcg by mouth every Monday, Wednesday, and Friday.    Past Week  . acetaminophen (TYLENOL) 500 MG tablet Take 1-2 tablets (500-1,000 mg total) by mouth every 6 (six) hours as needed. 60 tablet 0 09/28/2017  . pantoprazole (PROTONIX) 20 MG tablet Take 1 tablet (20 mg total) by mouth daily. (Patient not taking: Reported on 09/29/2017) 30 tablet 1 Not Taking  at Unknown time   Allergies  Allergen Reactions  . Amoxicillin Other (See Comments)    blisters  . Asa [Aspirin] Nausea And Vomiting and Other (See Comments)    Stomach irritation  . Doxycycline Other (See Comments)    Blisters on toes     Social History   Tobacco Use  . Smoking status: Never Smoker  . Smokeless tobacco: Never Used  Substance Use Topics  . Alcohol use: Yes    Alcohol/week: 3.6 oz    Types: 6 Glasses of wine per week    Comment: social-wine    Family History  Problem Relation Age of Onset  . Allergic rhinitis Father   . Liver disease Father   . Alzheimer's disease Mother   . Hypothyroidism Mother   . Osteoporosis Mother   . Colon cancer Neg Hx   . Asthma Neg Hx   . Atopy Neg Hx   . Eczema Neg Hx   . Immunodeficiency Neg Hx   . Urticaria Neg Hx   . Esophageal cancer Neg Hx   . Rectal cancer Neg Hx   . Stomach cancer Neg Hx      Review of Systems  Constitutional: Negative.   HENT: Negative.   Eyes: Negative.   Respiratory: Negative.   Cardiovascular: Negative.   Gastrointestinal: Negative.   Genitourinary: Negative.   Musculoskeletal: Positive for joint pain.  Skin: Negative.   Neurological: Negative.   Endo/Heme/Allergies: Positive for environmental allergies.  Psychiatric/Behavioral: Negative.     Objective:  Physical Exam  Constitutional: She is oriented to person, place, and time. She appears well-developed.  HENT:  Head: Normocephalic.  Eyes: Pupils are equal, round, and reactive to light.  Neck: Neck supple. No JVD present. No tracheal deviation present. No thyromegaly present.  Cardiovascular: Normal rate, regular rhythm and intact distal pulses.  Respiratory: Effort normal and breath sounds normal. No respiratory distress. She has no wheezes.  GI: Soft. There is no tenderness. There is no guarding.  Musculoskeletal:       Right hip: She exhibits decreased range of motion, decreased strength, tenderness and bony tenderness. She  exhibits no swelling, no deformity and no laceration.  Lymphadenopathy:    She has no cervical adenopathy.  Neurological: She is alert and oriented to person, place, and time.  Skin: Skin is warm and dry.  Psychiatric: She has a normal mood and affect.      Labs:  Estimated body mass index is 20.65 kg/m as calculated from the following:   Height as of 12/14/17: 5' 8.5" (1.74 m).   Weight as of 12/14/17: 62.5 kg (137 lb 12.8 oz).   Imaging Review Plain radiographs demonstrate severe degenerative joint disease of the right hip(s).  The bone quality appears to be good for age and reported activity level.    Preoperative templating of the joint replacement has been completed, documented, and submitted to the Operating Room personnel in order to optimize intra-operative equipment management.     Assessment/Plan:  End stage arthritis, right hip  The patient history, physical examination, clinical judgement of the provider and imaging studies are consistent with end stage degenerative joint disease / AVN of the right hip and total hip arthroplasty is deemed medically necessary. The treatment options including medical management, injection therapy, arthroscopy and arthroplasty were discussed at length. The risks and benefits of total hip arthroplasty were presented and reviewed. The risks due to aseptic loosening, infection, stiffness, dislocation/subluxation,  thromboembolic complications and other imponderables were discussed.  The patient acknowledged the explanation, agreed to proceed with the plan and consent was signed. Patient is being admitted for inpatient treatment for surgery, pain control, PT, OT, prophylactic antibiotics, VTE prophylaxis, progressive ambulation and ADL's and discharge planning.The patient is planning to be discharged home.    West Pugh Bach Rocchi   PA-C  12/18/2017, 9:30 PM

## 2017-12-19 ENCOUNTER — Encounter (HOSPITAL_COMMUNITY): Payer: Self-pay | Admitting: *Deleted

## 2017-12-19 ENCOUNTER — Inpatient Hospital Stay (HOSPITAL_COMMUNITY)
Admission: RE | Admit: 2017-12-19 | Discharge: 2017-12-20 | DRG: 470 | Disposition: A | Discharge status: Home / Self Care | Payer: Medicare Other | Attending: Orthopedic Surgery | Admitting: Orthopedic Surgery

## 2017-12-19 ENCOUNTER — Other Ambulatory Visit: Payer: Self-pay

## 2017-12-19 ENCOUNTER — Inpatient Hospital Stay (HOSPITAL_COMMUNITY): Payer: Medicare Other | Admitting: Anesthesiology

## 2017-12-19 ENCOUNTER — Encounter (HOSPITAL_COMMUNITY)
Admission: RE | Disposition: A | Discharge status: Home / Self Care | Payer: Self-pay | Source: Home / Self Care | Attending: Orthopedic Surgery

## 2017-12-19 ENCOUNTER — Inpatient Hospital Stay (HOSPITAL_COMMUNITY): Payer: Medicare Other

## 2017-12-19 DIAGNOSIS — Z881 Allergy status to other antibiotic agents status: Secondary | ICD-10-CM

## 2017-12-19 DIAGNOSIS — Z96649 Presence of unspecified artificial hip joint: Secondary | ICD-10-CM

## 2017-12-19 DIAGNOSIS — M1611 Unilateral primary osteoarthritis, right hip: Principal | ICD-10-CM | POA: Diagnosis present

## 2017-12-19 DIAGNOSIS — Z79899 Other long term (current) drug therapy: Secondary | ICD-10-CM

## 2017-12-19 DIAGNOSIS — J3089 Other allergic rhinitis: Secondary | ICD-10-CM | POA: Diagnosis present

## 2017-12-19 DIAGNOSIS — M25751 Osteophyte, right hip: Secondary | ICD-10-CM | POA: Diagnosis present

## 2017-12-19 DIAGNOSIS — Z886 Allergy status to analgesic agent status: Secondary | ICD-10-CM | POA: Diagnosis not present

## 2017-12-19 DIAGNOSIS — M8788 Other osteonecrosis, other site: Secondary | ICD-10-CM | POA: Diagnosis present

## 2017-12-19 DIAGNOSIS — Z90710 Acquired absence of both cervix and uterus: Secondary | ICD-10-CM | POA: Diagnosis not present

## 2017-12-19 DIAGNOSIS — Z86010 Personal history of colonic polyps: Secondary | ICD-10-CM | POA: Diagnosis not present

## 2017-12-19 DIAGNOSIS — Z820 Family history of epilepsy and other diseases of the nervous system: Secondary | ICD-10-CM

## 2017-12-19 DIAGNOSIS — Z8262 Family history of osteoporosis: Secondary | ICD-10-CM

## 2017-12-19 HISTORY — PX: TOTAL HIP ARTHROPLASTY: SHX124

## 2017-12-19 LAB — TYPE AND SCREEN
ABO/RH(D): O POS
ANTIBODY SCREEN: NEGATIVE

## 2017-12-19 SURGERY — ARTHROPLASTY, HIP, TOTAL, ANTERIOR APPROACH
Anesthesia: Spinal | Site: Hip | Laterality: Right

## 2017-12-19 MED ORDER — STERILE WATER FOR IRRIGATION IR SOLN
Status: DC | PRN
  Administered 2017-12-19: 2000 mL

## 2017-12-19 MED ORDER — LORATADINE 10 MG PO TABS
10.0000 mg | ORAL_TABLET | Freq: Every day | ORAL | Status: DC
  Administered 2017-12-20: 10 mg via ORAL
  Filled 2017-12-19: qty 1

## 2017-12-19 MED ORDER — PROMETHAZINE HCL 25 MG/ML IJ SOLN
6.2500 mg | Freq: Once | INTRAMUSCULAR | Status: AC
  Administered 2017-12-19: 6.25 mg via INTRAVENOUS

## 2017-12-19 MED ORDER — HYDROMORPHONE HCL 1 MG/ML IJ SOLN
INTRAMUSCULAR | Status: AC
  Administered 2017-12-19: 0.5 mg via INTRAVENOUS
  Filled 2017-12-19: qty 2

## 2017-12-19 MED ORDER — POLYETHYLENE GLYCOL 3350 17 G PO PACK: 17.0000 g | PACK | Freq: Two times a day (BID) | ORAL | 0 refills | Status: DC

## 2017-12-19 MED ORDER — BUPIVACAINE HCL (PF) 0.75 % IJ SOLN
INTRAMUSCULAR | Status: DC | PRN
  Administered 2017-12-19: 2 mL via INTRATHECAL

## 2017-12-19 MED ORDER — DOCUSATE SODIUM 100 MG PO CAPS
100.0000 mg | ORAL_CAPSULE | Freq: Two times a day (BID) | ORAL | Status: DC
  Administered 2017-12-19 – 2017-12-20 (×2): 100 mg via ORAL
  Filled 2017-12-19 (×2): qty 1

## 2017-12-19 MED ORDER — MORPHINE SULFATE (PF) 2 MG/ML IV SOLN
0.5000 mg | INTRAVENOUS | Status: DC | PRN
  Administered 2017-12-19 (×2): 1 mg via INTRAVENOUS
  Filled 2017-12-19 (×2): qty 1

## 2017-12-19 MED ORDER — FENTANYL CITRATE (PF) 100 MCG/2ML IJ SOLN
INTRAMUSCULAR | Status: AC
  Filled 2017-12-19: qty 2

## 2017-12-19 MED ORDER — TRANEXAMIC ACID 1000 MG/10ML IV SOLN
1000.0000 mg | Freq: Once | INTRAVENOUS | Status: AC
  Administered 2017-12-19: 1000 mg via INTRAVENOUS
  Filled 2017-12-19: qty 1100

## 2017-12-19 MED ORDER — DEXAMETHASONE SODIUM PHOSPHATE 10 MG/ML IJ SOLN
INTRAMUSCULAR | Status: AC
  Filled 2017-12-19: qty 1

## 2017-12-19 MED ORDER — METHOCARBAMOL 500 MG PO TABS: 500.0000 mg | ORAL_TABLET | Freq: Four times a day (QID) | ORAL | 0 refills | Status: DC | PRN

## 2017-12-19 MED ORDER — ONDANSETRON HCL 4 MG/2ML IJ SOLN
INTRAMUSCULAR | Status: DC | PRN
  Administered 2017-12-19: 4 mg via INTRAVENOUS

## 2017-12-19 MED ORDER — PROMETHAZINE HCL 25 MG/ML IJ SOLN
INTRAMUSCULAR | Status: AC
  Filled 2017-12-19: qty 1

## 2017-12-19 MED ORDER — VANCOMYCIN HCL IN DEXTROSE 1-5 GM/200ML-% IV SOLN
1000.0000 mg | Freq: Two times a day (BID) | INTRAVENOUS | Status: AC
  Administered 2017-12-20: 1000 mg via INTRAVENOUS
  Filled 2017-12-19: qty 200

## 2017-12-19 MED ORDER — LACTATED RINGERS IV SOLN
INTRAVENOUS | Status: DC
  Administered 2017-12-19 (×3): via INTRAVENOUS

## 2017-12-19 MED ORDER — CELECOXIB 200 MG PO CAPS
200.0000 mg | ORAL_CAPSULE | Freq: Two times a day (BID) | ORAL | Status: DC
  Administered 2017-12-19 – 2017-12-20 (×2): 200 mg via ORAL
  Filled 2017-12-19 (×2): qty 1

## 2017-12-19 MED ORDER — DOCUSATE SODIUM 100 MG PO CAPS: 100.0000 mg | ORAL_CAPSULE | Freq: Two times a day (BID) | ORAL | 0 refills | Status: DC

## 2017-12-19 MED ORDER — HYDROMORPHONE HCL 1 MG/ML IJ SOLN
0.2500 mg | INTRAMUSCULAR | Status: DC | PRN
  Administered 2017-12-19 (×4): 0.5 mg via INTRAVENOUS

## 2017-12-19 MED ORDER — ONDANSETRON HCL 4 MG/2ML IJ SOLN
INTRAMUSCULAR | Status: AC
  Filled 2017-12-19: qty 2

## 2017-12-19 MED ORDER — PROMETHAZINE HCL 25 MG/ML IJ SOLN: 6.2500 mg | Freq: Once | INTRAMUSCULAR | Status: DC

## 2017-12-19 MED ORDER — METOCLOPRAMIDE HCL 5 MG/ML IJ SOLN
5.0000 mg | Freq: Three times a day (TID) | INTRAMUSCULAR | Status: DC | PRN
  Administered 2017-12-19: 10 mg via INTRAVENOUS
  Administered 2017-12-20: 5 mg via INTRAVENOUS
  Filled 2017-12-19 (×2): qty 2

## 2017-12-19 MED ORDER — RIVAROXABAN 10 MG PO TABS
10.0000 mg | ORAL_TABLET | ORAL | Status: DC
  Administered 2017-12-20: 10 mg via ORAL
  Filled 2017-12-19: qty 1

## 2017-12-19 MED ORDER — FERROUS SULFATE 325 (65 FE) MG PO TABS: 325.0000 mg | ORAL_TABLET | Freq: Three times a day (TID) | ORAL | 3 refills | Status: DC

## 2017-12-19 MED ORDER — METHOCARBAMOL 500 MG PO TABS: 500.0000 mg | ORAL_TABLET | Freq: Four times a day (QID) | ORAL | Status: DC | PRN

## 2017-12-19 MED ORDER — VANCOMYCIN HCL IN DEXTROSE 1-5 GM/200ML-% IV SOLN
1000.0000 mg | INTRAVENOUS | Status: AC
  Administered 2017-12-19: 1000 mg via INTRAVENOUS
  Filled 2017-12-19: qty 200

## 2017-12-19 MED ORDER — SODIUM CHLORIDE 0.9 % IV SOLN
INTRAVENOUS | Status: DC
  Administered 2017-12-19: 19:00:00 via INTRAVENOUS

## 2017-12-19 MED ORDER — PROMETHAZINE HCL 25 MG/ML IJ SOLN
INTRAMUSCULAR | Status: AC
  Administered 2017-12-19: 6.25 mg via INTRAVENOUS
  Filled 2017-12-19: qty 1

## 2017-12-19 MED ORDER — ONDANSETRON HCL 4 MG PO TABS: 4.0000 mg | ORAL_TABLET | Freq: Four times a day (QID) | ORAL | Status: DC | PRN

## 2017-12-19 MED ORDER — PROPOFOL 10 MG/ML IV BOLUS
INTRAVENOUS | Status: AC
  Filled 2017-12-19: qty 20

## 2017-12-19 MED ORDER — ONDANSETRON HCL 4 MG/2ML IJ SOLN
4.0000 mg | Freq: Four times a day (QID) | INTRAMUSCULAR | Status: DC | PRN
  Administered 2017-12-19 – 2017-12-20 (×2): 4 mg via INTRAVENOUS
  Filled 2017-12-19 (×2): qty 2

## 2017-12-19 MED ORDER — HYDROMORPHONE HCL 1 MG/ML IJ SOLN
INTRAMUSCULAR | Status: AC
  Administered 2017-12-19: 0.5 mg via INTRAVENOUS
  Filled 2017-12-19: qty 1

## 2017-12-19 MED ORDER — HYDROCODONE-ACETAMINOPHEN 7.5-325 MG PO TABS: 1.0000 | ORAL_TABLET | ORAL | 0 refills | Status: DC | PRN

## 2017-12-19 MED ORDER — RIVAROXABAN 10 MG PO TABS: 10.0000 mg | ORAL_TABLET | Freq: Every day | ORAL | 0 refills | Status: DC

## 2017-12-19 MED ORDER — EPHEDRINE SULFATE 50 MG/ML IJ SOLN
INTRAMUSCULAR | Status: DC | PRN
  Administered 2017-12-19 (×2): 10 mg via INTRAVENOUS

## 2017-12-19 MED ORDER — DEXAMETHASONE SODIUM PHOSPHATE 10 MG/ML IJ SOLN
10.0000 mg | Freq: Once | INTRAMUSCULAR | Status: AC
  Administered 2017-12-20: 10 mg via INTRAVENOUS
  Filled 2017-12-19: qty 1

## 2017-12-19 MED ORDER — MAGNESIUM CITRATE PO SOLN: 1.0000 | Freq: Once | ORAL | Status: DC | PRN

## 2017-12-19 MED ORDER — FENTANYL CITRATE (PF) 100 MCG/2ML IJ SOLN
INTRAMUSCULAR | Status: DC | PRN
  Administered 2017-12-19: 100 ug via INTRAVENOUS

## 2017-12-19 MED ORDER — DEXAMETHASONE SODIUM PHOSPHATE 10 MG/ML IJ SOLN
10.0000 mg | Freq: Once | INTRAMUSCULAR | Status: AC
  Administered 2017-12-19: 10 mg via INTRAVENOUS

## 2017-12-19 MED ORDER — DIPHENHYDRAMINE HCL 12.5 MG/5ML PO ELIX
12.5000 mg | ORAL_SOLUTION | ORAL | Status: DC | PRN
  Filled 2017-12-19: qty 10

## 2017-12-19 MED ORDER — SODIUM CHLORIDE 0.9 % IR SOLN
Status: DC | PRN
  Administered 2017-12-19: 1000 mL

## 2017-12-19 MED ORDER — LIDOCAINE 2% (20 MG/ML) 5 ML SYRINGE
INTRAMUSCULAR | Status: AC
  Filled 2017-12-19: qty 5

## 2017-12-19 MED ORDER — METOCLOPRAMIDE HCL 5 MG PO TABS: 5.0000 mg | ORAL_TABLET | Freq: Three times a day (TID) | ORAL | Status: DC | PRN

## 2017-12-19 MED ORDER — HYDROMORPHONE HCL 1 MG/ML IJ SOLN
0.2500 mg | INTRAMUSCULAR | Status: DC | PRN
  Administered 2017-12-19 (×2): 0.5 mg via INTRAVENOUS

## 2017-12-19 MED ORDER — PHENOL 1.4 % MT LIQD
1.0000 | OROMUCOSAL | Status: DC | PRN
  Filled 2017-12-19: qty 177

## 2017-12-19 MED ORDER — MIDAZOLAM HCL 2 MG/2ML IJ SOLN
INTRAMUSCULAR | Status: AC
  Filled 2017-12-19: qty 2

## 2017-12-19 MED ORDER — BISACODYL 10 MG RE SUPP
10.0000 mg | Freq: Every day | RECTAL | Status: DC | PRN
Start: 2017-12-19 — End: 2017-12-20

## 2017-12-19 MED ORDER — MIDAZOLAM HCL 5 MG/5ML IJ SOLN
INTRAMUSCULAR | Status: DC | PRN
  Administered 2017-12-19: 2 mg via INTRAVENOUS

## 2017-12-19 MED ORDER — ALUM & MAG HYDROXIDE-SIMETH 200-200-20 MG/5ML PO SUSP: 15.0000 mL | ORAL | Status: DC | PRN

## 2017-12-19 MED ORDER — FERROUS SULFATE 325 (65 FE) MG PO TABS
325.0000 mg | ORAL_TABLET | Freq: Three times a day (TID) | ORAL | Status: DC
  Filled 2017-12-19: qty 1

## 2017-12-19 MED ORDER — ACETAMINOPHEN 325 MG PO TABS
325.0000 mg | ORAL_TABLET | Freq: Four times a day (QID) | ORAL | Status: DC | PRN
Start: 2017-12-20 — End: 2017-12-20
  Administered 2017-12-19: 650 mg via ORAL
  Filled 2017-12-19: qty 2

## 2017-12-19 MED ORDER — MENTHOL 3 MG MT LOZG: 1.0000 | LOZENGE | OROMUCOSAL | Status: DC | PRN

## 2017-12-19 MED ORDER — CHLORHEXIDINE GLUCONATE 4 % EX LIQD: 60.0000 mL | Freq: Once | CUTANEOUS | Status: DC

## 2017-12-19 MED ORDER — PROPOFOL 500 MG/50ML IV EMUL
INTRAVENOUS | Status: DC | PRN
  Administered 2017-12-19: 75 ug/kg/min via INTRAVENOUS

## 2017-12-19 MED ORDER — PROPOFOL 10 MG/ML IV BOLUS
INTRAVENOUS | Status: AC
  Filled 2017-12-19: qty 60

## 2017-12-19 MED ORDER — HYDROCODONE-ACETAMINOPHEN 5-325 MG PO TABS: 1.0000 | ORAL_TABLET | ORAL | Status: DC | PRN

## 2017-12-19 MED ORDER — POLYETHYLENE GLYCOL 3350 17 G PO PACK
17.0000 g | PACK | Freq: Two times a day (BID) | ORAL | Status: DC
  Filled 2017-12-19 (×2): qty 1

## 2017-12-19 MED ORDER — HYDROCODONE-ACETAMINOPHEN 7.5-325 MG PO TABS
1.0000 | ORAL_TABLET | ORAL | Status: DC | PRN
  Administered 2017-12-19 – 2017-12-20 (×3): 2 via ORAL
  Filled 2017-12-19 (×3): qty 2

## 2017-12-19 MED ORDER — METHOCARBAMOL 1000 MG/10ML IJ SOLN
500.0000 mg | Freq: Four times a day (QID) | INTRAVENOUS | Status: DC | PRN
  Administered 2017-12-19: 500 mg via INTRAVENOUS
  Filled 2017-12-19: qty 5
  Filled 2017-12-19: qty 550

## 2017-12-19 SURGICAL SUPPLY — 42 items
BAG ZIPLOCK 12X15 (MISCELLANEOUS) IMPLANT
BLADE SAG 18X100X1.27 (BLADE) ×2 IMPLANT
CAPT HIP TOTAL 2 ×2 IMPLANT
COVER PERINEAL POST (MISCELLANEOUS) ×2 IMPLANT
COVER SURGICAL LIGHT HANDLE (MISCELLANEOUS) ×2 IMPLANT
DERMABOND ADVANCED (GAUZE/BANDAGES/DRESSINGS) ×1
DERMABOND ADVANCED .7 DNX12 (GAUZE/BANDAGES/DRESSINGS) ×1 IMPLANT
DRAPE STERI IOBAN 125X83 (DRAPES) ×2 IMPLANT
DRAPE U-SHAPE 47X51 STRL (DRAPES) ×4 IMPLANT
DRESSING AQUACEL AG SP 3.5X10 (GAUZE/BANDAGES/DRESSINGS) ×1 IMPLANT
DRSG AQUACEL AG SP 3.5X10 (GAUZE/BANDAGES/DRESSINGS) ×2
DURAPREP 26ML APPLICATOR (WOUND CARE) ×2 IMPLANT
ELECT REM PT RETURN 15FT ADLT (MISCELLANEOUS) ×2 IMPLANT
GLOVE BIO SURGEON STRL SZ 6.5 (GLOVE) ×2 IMPLANT
GLOVE BIOGEL PI IND STRL 6.5 (GLOVE) ×1 IMPLANT
GLOVE BIOGEL PI IND STRL 7.5 (GLOVE) ×2 IMPLANT
GLOVE BIOGEL PI IND STRL 8 (GLOVE) ×1 IMPLANT
GLOVE BIOGEL PI IND STRL 8.5 (GLOVE) ×1 IMPLANT
GLOVE BIOGEL PI INDICATOR 6.5 (GLOVE) ×1
GLOVE BIOGEL PI INDICATOR 7.5 (GLOVE) ×2
GLOVE BIOGEL PI INDICATOR 8 (GLOVE) ×1
GLOVE BIOGEL PI INDICATOR 8.5 (GLOVE) ×1
GLOVE ECLIPSE 7.5 STRL STRAW (GLOVE) ×2 IMPLANT
GLOVE ECLIPSE 8.0 STRL XLNG CF (GLOVE) ×4 IMPLANT
GLOVE ORTHO TXT STRL SZ7.5 (GLOVE) ×2 IMPLANT
GOWN STRL REUS W/ TWL LRG LVL3 (GOWN DISPOSABLE) ×1 IMPLANT
GOWN STRL REUS W/ TWL XL LVL3 (GOWN DISPOSABLE) ×1 IMPLANT
GOWN STRL REUS W/TWL 2XL LVL3 (GOWN DISPOSABLE) ×2 IMPLANT
GOWN STRL REUS W/TWL LRG LVL3 (GOWN DISPOSABLE) ×3 IMPLANT
GOWN STRL REUS W/TWL XL LVL3 (GOWN DISPOSABLE) ×1
HOLDER FOLEY CATH W/STRAP (MISCELLANEOUS) ×2 IMPLANT
PACK ANTERIOR HIP CUSTOM (KITS) ×2 IMPLANT
SUCTION FRAZIER HANDLE 12FR (TUBING) ×1
SUCTION TUBE FRAZIER 12FR DISP (TUBING) ×1 IMPLANT
SUT MNCRL AB 4-0 PS2 18 (SUTURE) ×2 IMPLANT
SUT STRATAFIX 0 PDS 27 VIOLET (SUTURE) ×2
SUT VIC AB 1 CT1 36 (SUTURE) ×8 IMPLANT
SUT VIC AB 2-0 CT1 27 (SUTURE) ×2
SUT VIC AB 2-0 CT1 TAPERPNT 27 (SUTURE) ×2 IMPLANT
SUTURE STRATFX 0 PDS 27 VIOLET (SUTURE) ×1 IMPLANT
TRAY FOLEY CATH 14FR (SET/KITS/TRAYS/PACK) ×2 IMPLANT
YANKAUER SUCT BULB TIP 10FT TU (MISCELLANEOUS) ×2 IMPLANT

## 2017-12-19 NOTE — Discharge Instructions (Addendum)

## 2017-12-19 NOTE — Anesthesia Procedure Notes (Signed)
Spinal  Start time: 12/19/2017 2:45 PM End time: 12/19/2017 2:49 PM Staffing Resident/CRNA: Gean Maidens, CRNA Performed: resident/CRNA  Preanesthetic Checklist Completed: patient identified, site marked, surgical consent, pre-op evaluation, timeout performed, IV checked, risks and benefits discussed and monitors and equipment checked Spinal Block Patient position: sitting Prep: Betadine Patient monitoring: heart rate, blood pressure and continuous pulse ox Approach: midline Location: L3-4 Injection technique: single-shot Needle Needle type: Quincke  Needle gauge: 22 G Needle length: 9 cm Needle insertion depth: 6 cm Additional Notes Pt sitting position sterile prep and drape, negative paresthesia/heme

## 2017-12-19 NOTE — Interval H&P Note (Signed)
History and Physical Interval Note:  12/19/2017 2:37 PM  Kaitlyn Spencer  has presented today for surgery, with the diagnosis of Right hip avascular necrosis  The various methods of treatment have been discussed with the patient and family. After consideration of risks, benefits and other options for treatment, the patient has consented to  Procedure(s) with comments: RIGHT TOTAL HIP ARTHROPLASTY ANTERIOR APPROACH (Right) - 90 mins as a surgical intervention .  The patient's history has been reviewed, patient examined, no change in status, stable for surgery.  I have reviewed the patient's chart and labs.  Questions were answered to the patient's satisfaction.     Mauri Pole

## 2017-12-19 NOTE — Op Note (Signed)
NAME:  Kaitlyn Spencer                ACCOUNT NO.: [id number]      MEDICAL RECORD NO.: [id number]      FACILITY:  [facility]      PHYSICIAN:  Mauri Pole  DATE OF BIRTH:  [birth year]     DATE OF PROCEDURE:  12/19/2017                                 OPERATIVE REPORT         PREOPERATIVE DIAGNOSIS: Right  hip avascular necrosis after percutaneous screw fixation for right femoral neck fracture.      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      PROCEDURE:  Conversion of previous failed right hip surgery (AVN) to right total hip replacement through an anterior approach   utilizing DePuy THR system, component size 31mm pinnacle cup, a size 36+4 neutral   Altrex liner, a size 7 Hi Tri Lock stem with a 36+1.5 delta ceramic   ball.      SURGEON:  Pietro Cassis. Alvan Dame, M.D.      ASSISTANT:  Danae Orleans, PA-C     ANESTHESIA:  Spinal.      SPECIMENS:  None.      COMPLICATIONS:  None.      BLOOD LOSS:  350 cc     DRAINS:  None.      INDICATION OF THE PROCEDURE:  Kaitlyn Spencer is a 67 y.o. female who had   presented to office for evaluation of right hip pain after early successful treatment of right femoral neck fracture with percutaneous screws.  Unfortunately, she developed avascular necrosis of her femoral head with collapse of her femoral head associated with significant pain and functional limitations.  The patient had painful limited range of   motion significantly affecting their overall quality of life.  The patient was failing to    respond to conservative measures, and at this point was ready   to proceed with more definitive measures.  Consent was obtained for   benefit of pain relief.  Specific risk of infection, DVT, component   failure, dislocation, need for revision surgery, as well discussion of   the anterior versus posterior approach were reviewed.  Consent was   obtained  for benefit of anterior pain relief through an anterior   approach.      PROCEDURE IN DETAIL:  The patient was brought to operative theater.   Once adequate anesthesia, preoperative antibiotics, 2 gm of Ancef, 1 gm of Tranexamic Acid, and 10 mg of Decadron administered.   The patient was positioned supine on the OSI Hanna table.  Once adequate   padding of boney process was carried out, we had predraped out the hip, and  used fluoroscopy to confirm orientation of the pelvis and position.      The right hip was then prepped and draped from proximal iliac crest to   mid thigh with shower curtain technique.      Time-out was performed identifying the patient, planned procedure, and   extremity.     An incision was then made 2 cm distal and lateral to the   anterior superior iliac spine extending over the orientation of the   tensor fascia lata muscle and sharp dissection was carried down to  the   fascia of the muscle and retractors placed in the soft tissues. The incision was extended distally enough to expose the screws on the lateral proximal femur.  Once identified the screws were removed.  Attention was now directed to performing the total hip replacement.     The fascia was then incised.  The muscle belly was identified and swept   laterally and retractor placed along the superior neck.  Following   cauterization of the circumflex vessels and removing some pericapsular   fat, a second cobra retractor was placed on the inferior neck.  A third   retractor was placed on the anterior acetabulum after elevating the   anterior rectus.  A L-capsulotomy was along the line of the   superior neck to the trochanteric fossa, then extended proximally and   distally.  Tag sutures were placed and the retractors were then placed   intracapsular.  We then identified the trochanteric fossa and   orientation of my neck cut, confirmed this radiographically   and then made a neck osteotomy with the  femur on traction.  The femoral   head was removed without difficulty or complication.  Traction was let   off and retractors were placed posterior and anterior around the   acetabulum.      The labrum and foveal tissue were debrided.  I began reaming with a 46 mm  reamer and reamed up to 51 mm reamer with good bony bed preparation and a 52 mm  cup was chosen.  The final 52 mm Pinnacle cup was then impacted under fluoroscopy  to confirm the depth of penetration and orientation with respect to   abduction.  A screw was placed followed by the hole eliminator.  The final   36+4 neutral Altrex liner was impacted with good visualized rim fit.  The cup was positioned anatomically within the acetabular portion of the pelvis.      At this point, the femur was rolled at 80 degrees.  Further capsule was   released off the inferior aspect of the femoral neck.  I then   released the superior capsule proximally.  The hook was placed laterally   along the femur and elevated manually and held in position with the bed   hook.  The leg was then extended and adducted with the leg rolled to 100   degrees of external rotation.  Once the proximal femur was fully   exposed, I used a box osteotome to set orientation.  I then began   broaching with the starting chili pepper broach and passed this by hand and then broached up to 7.  With the 7 broach in place I chose a high offset neck and did several trial reductions.  The offset was appropriate, leg lengths   appeared to be equal best matched with the +1.5 head ball confirmed radiographically.   Given these findings, I went ahead and dislocated the hip, repositioned all   retractors and positioned the right hip in the extended and abducted position.  The final 7 Hi Tri Lock stem was   chosen and it was impacted down to the level of neck cut.  Based on this   and the trial reduction, a 36+1.5 delta ceramic ball was chosen and   impacted onto a clean and dry  trunnion, and the hip was reduced.  The   hip had been irrigated throughout the case again at this point.  I did   reapproximate the  superior capsular leaflet to the anterior leaflet   using #1 Vicryl.  The fascia of the   tensor fascia lata muscle was then reapproximated using #1 Vicryl and #0 Stratafix sutures.  The   remaining wound was closed with 2-0 Vicryl and running 4-0 Monocryl.   The hip was cleaned, dried, and dressed sterilely using Dermabond and   Aquacel dressing.  She was then brought   to recovery room in stable condition tolerating the procedure well.    Danae Orleans, PA-C was present for the entirety of the case involved from   preoperative positioning, perioperative retractor management, general   facilitation of the case, as well as primary wound closure as assistant.            Pietro Cassis Alvan Dame, M.D.        12/19/2017 2:40 PM

## 2017-12-19 NOTE — Anesthesia Preprocedure Evaluation (Addendum)
Anesthesia Evaluation  Patient identified by MRN, date of birth, ID band Patient awake    Reviewed: Allergy & Precautions, H&P , NPO status , Patient's Chart, lab work & pertinent test results  History of Anesthesia Complications (+) PONV  Airway Mallampati: I  TM Distance: >3 FB Neck ROM: Full    Dental no notable dental hx. (+) Teeth Intact, Dental Advisory Given   Pulmonary neg pulmonary ROS,    Pulmonary exam normal breath sounds clear to auscultation       Cardiovascular negative cardio ROS   Rhythm:Regular Rate:Normal     Neuro/Psych negative neurological ROS  negative psych ROS   GI/Hepatic negative GI ROS, Neg liver ROS,   Endo/Other  negative endocrine ROS  Renal/GU negative Renal ROS  negative genitourinary   Musculoskeletal  (+) Arthritis , Osteoarthritis,    Abdominal   Peds  Hematology negative hematology ROS (+)   Anesthesia Other Findings   Reproductive/Obstetrics negative OB ROS                            Anesthesia Physical Anesthesia Plan  ASA: I  Anesthesia Plan: Spinal   Post-op Pain Management:    Induction: Intravenous  PONV Risk Score and Plan: 4 or greater and Ondansetron, Dexamethasone, Propofol infusion and Midazolam  Airway Management Planned: Simple Face Mask  Additional Equipment:   Intra-op Plan:   Post-operative Plan:   Informed Consent: I have reviewed the patients History and Physical, chart, labs and discussed the procedure including the risks, benefits and alternatives for the proposed anesthesia with the patient or authorized representative who has indicated his/her understanding and acceptance.   Dental advisory given  Plan Discussed with: CRNA  Anesthesia Plan Comments:         Anesthesia Quick Evaluation

## 2017-12-19 NOTE — Transfer of Care (Signed)
Immediate Anesthesia Transfer of Care Note  Patient: Kaitlyn Spencer  Procedure(s) Performed: HARDWARE REMOVAL RIGHT HIP, RIGHT TOTAL HIP ARTHROPLASTY ANTERIOR APPROACH (Right Hip)  Patient Location: PACU  Anesthesia Type:Spinal  Level of Consciousness: awake, alert  and oriented  Airway & Oxygen Therapy: Patient Spontanous Breathing and Patient connected to face mask oxygen  Post-op Assessment: Report given to RN and Post -op Vital signs reviewed and stable  Post vital signs: Reviewed and stable  Last Vitals:  Vitals Value Taken Time  BP    Temp    Pulse 57 12/19/2017  5:01 PM  Resp 14 12/19/2017  5:01 PM  SpO2 100 % 12/19/2017  5:01 PM  Vitals shown include unvalidated device data.  Last Pain:  Vitals:   12/19/17 1222  TempSrc:   PainSc: 0-No pain         Complications: No apparent anesthesia complications

## 2017-12-19 NOTE — Anesthesia Postprocedure Evaluation (Signed)
Anesthesia Post Note  Patient: Kaitlyn Spencer  Procedure(s) Performed: HARDWARE REMOVAL RIGHT HIP, RIGHT TOTAL HIP ARTHROPLASTY ANTERIOR APPROACH (Right Hip)     Patient location during evaluation: PACU Anesthesia Type: Spinal Level of consciousness: oriented and awake and alert Pain management: pain level controlled Vital Signs Assessment: post-procedure vital signs reviewed and stable Respiratory status: spontaneous breathing, respiratory function stable and patient connected to nasal cannula oxygen Cardiovascular status: blood pressure returned to baseline and stable Postop Assessment: no headache, no backache and no apparent nausea or vomiting Anesthetic complications: no    Last Vitals:  Vitals:   12/19/17 2022 12/19/17 2129  BP: 137/81 136/83  Pulse: (!) 55 (!) 55  Resp: 16 17  Temp: 36.6 C 36.7 C  SpO2: 100% 100%    Last Pain:  Vitals:   12/19/17 2129  TempSrc: Oral  PainSc:                  [name],W. EDMOND

## 2017-12-20 ENCOUNTER — Encounter (HOSPITAL_COMMUNITY): Payer: Self-pay | Admitting: Orthopedic Surgery

## 2017-12-20 LAB — BASIC METABOLIC PANEL
ANION GAP: 11 (ref 5–15)
BUN: 9 mg/dL (ref 6–20)
CO2: 23 mmol/L (ref 22–32)
Calcium: 8.1 mg/dL — ABNORMAL LOW (ref 8.9–10.3)
Chloride: 105 mmol/L (ref 101–111)
Creatinine, Ser: 0.59 mg/dL (ref 0.44–1.00)
GFR calc Af Amer: >60 mL/min (ref >60)
GFR calc non Af Amer: >60 mL/min (ref >60)
GLUCOSE: 150 mg/dL — AB (ref 65–99)
POTASSIUM: 4.2 mmol/L (ref 3.5–5.1)
Sodium: 139 mmol/L (ref 135–145)

## 2017-12-20 LAB — CBC
HEMATOCRIT: 32.7 % — AB (ref 36.0–46.0)
Hemoglobin: 10.6 g/dL — ABNORMAL LOW (ref 12.0–15.0)
MCH: 30.3 pg (ref 26.0–34.0)
MCHC: 32.4 g/dL (ref 30.0–36.0)
MCV: 93.4 fL (ref 78.0–100.0)
Platelets: 225 10*3/uL (ref 150–400)
RBC: 3.5 MIL/uL — AB (ref 3.87–5.11)
RDW: 13 % (ref 11.5–15.5)
WBC: 8.4 10*3/uL (ref 4.0–10.5)

## 2017-12-20 MED ORDER — ONDANSETRON HCL 4 MG PO TABS: 4.0000 mg | ORAL_TABLET | Freq: Four times a day (QID) | ORAL | 0 refills | Status: DC | PRN

## 2017-12-20 NOTE — Progress Notes (Signed)
Patient ID: Kaitlyn Spencer, female   DOB: 06/17/1951, 67 y.o.   MRN: [id number] Subjective: 1 Day Post-Op Procedure(s) (LRB): HARDWARE REMOVAL RIGHT HIP, RIGHT TOTAL HIP ARTHROPLASTY ANTERIOR APPROACH (Right)    Patient reports pain as mild at the moment.  Tough early post op period but recognizes that while laying in bed this am she feels better than she had prior to surgery.  Otherwise no events  Objective:   VITALS:   Vitals:   12/20/17 0103 12/20/17 0515  BP: 97/63 (!) 94/59  Pulse: (!) 54 (!) 54  Resp: 16 17  Temp: 97.8 F (36.6 C) 97.9 F (36.6 C)  SpO2: 100% 100%    Neurovascular intact Incision: dressing C/D/I  LABS Recent Labs    12/20/17 0542  HGB 10.6*  HCT 32.7*  WBC 8.4  PLT 225    Recent Labs    12/20/17 0542  NA 139  K 4.2  BUN 9  CREATININE 0.59  GLUCOSE 150*    No results for input(s): LABPT, INR in the last 72 hours.   Assessment/Plan: 1 Day Post-Op Procedure(s) (LRB): HARDWARE REMOVAL RIGHT HIP, RIGHT TOTAL HIP ARTHROPLASTY ANTERIOR APPROACH (Right)   Advance diet Up with therapy  Will plan for discharge today after therapy dependent  RTC in 2 weeks Reviewed procedure and post op plan

## 2017-12-20 NOTE — Evaluation (Signed)
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 Hx of spinal fusion, R femoral neck screw fixation 04/2017.    Clinical Impression  On eval, pt was Min guard assist for mobility. She walked ~60 feet with a RW. Mild pain with activity. She did c/o lightheadedness towards end of ambulation distance. Pt requested to return to bed. Assessed BP-106/75. Encouraged pt to try to sit up for lunch. Will plan to have a 2nd session prior to d/c home later today.     Follow Up Recommendations Follow surgeon's recommendation for DC plan and follow-up therapies    Equipment Recommendations  None recommended by PT    Recommendations for Other Services       Precautions / Restrictions Precautions Precautions: Fall Restrictions Weight Bearing Restrictions: No Other Position/Activity Restrictions: WBAT      Mobility  Bed Mobility Overal bed mobility: Needs Assistance Bed Mobility: Supine to Sit     Supine to sit: Min guard     General bed mobility comments: close guard for safety. Pt hooked R LE with L foot.   Transfers Overall transfer level: Needs assistance Equipment used: Rolling walker (2 wheeled) Transfers: Sit to/from Stand Sit to Stand: Min guard         General transfer comment: close guard for safety. VCs safety, hand/LE placement.   Ambulation/Gait Ambulation/Gait assistance: Min guard Ambulation Distance (Feet): 60 Feet Assistive device: Rolling walker (2 wheeled) Gait Pattern/deviations: Step-to pattern     General Gait Details: VCs safety, sequence, step length. close guard for safety. Pt c/o some lightheadedness towards end of distance. She requested to return to bed.   Stairs            Wheelchair Mobility    Modified Rankin (Stroke Patients Only)       Balance Overall  balance assessment: Mild deficits observed, not formally tested                                           Pertinent Vitals/Pain Pain Assessment: 0-10 Pain Score: 4  Pain Location: R hip/thigh Pain Descriptors / Indicators: Aching;Sore Pain Intervention(s): Ice applied;Repositioned;Monitored during session    Home Living Family/patient expects to be discharged to:: Private residence Living Arrangements: Spouse/significant other Available Help at Discharge: Family Type of Home: Apartment([place]) Home Access: Stairs to enter;Level entry   Entrance Stairs-Number of Steps: elevator for apt. 3 steps-no rail (house) Home Layout: One level Home Equipment: [place] - 2 wheels;Bedside commode Additional Comments: spouse works part-time, flexible hours. Discharging today to apartment here in [place] with elevator entry    Prior Function Level of Independence: Independent         Comments: very active     Hand Dominance   Dominant Hand: Right    Extremity/Trunk Assessment   Upper Extremity Assessment Upper Extremity Assessment: Overall WFL for tasks assessed    Lower Extremity Assessment Lower Extremity Assessment: Generalized weakness(s/p R THA)    Cervical / Trunk Assessment Cervical / Trunk Assessment: Normal  Communication   Communication: No difficulties  Cognition Arousal/Alertness: Awake/alert Behavior During Therapy: WFL for tasks assessed/performed Overall Cognitive Status: Within Functional Limits for tasks assessed  General Comments      Exercises Total Joint Exercises Ankle Circles/Pumps: AROM;Both;10 reps;Supine Quad Sets: AROM;Both;10 reps;Supine Short Arc Quad: AAROM;Right;10 reps;Supine Heel Slides: AAROM;Right;10 reps;Supine Hip ABduction/ADduction: AAROM;Right;10 reps;Supine   Assessment/Plan    PT Assessment Patient needs continued PT services  PT Problem List Decreased  balance;Decreased mobility;Decreased range of motion;Decreased strength;Decreased activity tolerance;Pain;Decreased knowledge of use of DME       PT Treatment Interventions DME instruction;Gait training;Functional mobility training;Therapeutic activities;Balance training;Patient/family education;Therapeutic exercise    PT Goals (Current goals can be found in the Care Plan section)  Acute Rehab PT Goals Patient Stated Goal: home. regain PLOF/independence PT Goal Formulation: With patient/family Time For Goal Achievement: 01/03/18 Potential to Achieve Goals: Good    Frequency 7X/week   Barriers to discharge        Co-evaluation               AM-PAC PT "6 Clicks" Daily Activity  Outcome Measure Difficulty turning over in bed (including adjusting bedclothes, sheets and blankets)?: A Lot Difficulty moving from lying on back to sitting on the side of the bed? : A Lot Difficulty sitting down on and standing up from a chair with arms (e.g., wheelchair, bedside commode, etc,.)?: A Little Help needed moving to and from a bed to chair (including a wheelchair)?: A Little Help needed walking in hospital room?: A Little Help needed climbing 3-5 steps with a railing? : A Lot 6 Click Score: 15    End of Session Equipment Utilized During Treatment: Gait belt Activity Tolerance: Patient tolerated treatment well Patient left: in bed;with call bell/phone within reach;with family/visitor present   PT Visit Diagnosis: Difficulty in walking, not elsewhere classified (R26.2);Pain Pain - Right/Left: Right Pain - part of body: Hip    Time: 1100-1132 PT Time Calculation (min) (ACUTE ONLY): 32 min   Charges:   PT Evaluation $PT Eval Low Complexity: 1 Low PT Treatments $Gait Training: 8-22 mins   PT G Codes:          Weston Anna, MPT Pager: [phone]

## 2017-12-20 NOTE — Progress Notes (Signed)
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 Hx of spinal fusion, R femoral neck screw fixation 04/2017.    PT Comments    Pt is still having issues with N/V per her report (made RN aware). She tolerated mobility/activity well. Reviewed/practiced gait training and stair training. Issued HEP for pt to perform 2-3x/day beginning tomorrow. Pt stated she is set up for OP PT. All education completed. Okay to d/c from PT standpoint-made RN aware.   Follow Up Recommendations  Follow surgeon's recommendation for DC plan and follow-up therapies     Equipment Recommendations  None recommended by PT    Recommendations for Other Services       Precautions / Restrictions Precautions Precautions: Fall Restrictions Weight Bearing Restrictions: No Other Position/Activity Restrictions: WBAT    Mobility  Bed Mobility Overal bed mobility: Needs Assistance Bed Mobility: Supine to Sit;Sit to Supine     Supine to sit: Min guard Sit to supine: Min guard   General bed mobility comments:  Pt hooked R LE with L foot.   Transfers Overall transfer level: Needs assistance Equipment used: Rolling walker (2 wheeled) Transfers: Sit to/from Stand Sit to Stand: Min guard         General transfer comment: close guard for safety. VCs safety, hand/LE placement.   Ambulation/Gait Ambulation/Gait assistance: Min guard Ambulation Distance (Feet): 60 Feet Assistive device: Rolling walker (2 wheeled) Gait Pattern/deviations: Step-to pattern     General Gait Details: VCs safety, sequence, step length. close guard for safety. Slow, steady gait speed.    Stairs Stairs: Yes Stairs assistance: Min assist Stair Management: Step to pattern;Forwards;One rail Right Number of Stairs: 2 General stair comments: up and over  portable steps. Pt practiced going up with 1 rail on R and 1 HHA from therapist. Cues safety, technique, sequence. Pt does not have to go up steps right away. Practice was refresher training for when pt does return to her home. By the time she returns to her home, she will likely be able to ascend/descend stairs with use of a cane/crutch and with assistance from her husband.    Wheelchair Mobility    Modified Rankin (Stroke Patients Only)       Balance Overall balance assessment: Mild deficits observed, not formally tested                                          Cognition Arousal/Alertness: Awake/alert Behavior During Therapy: WFL for tasks assessed/performed Overall Cognitive Status: Within Functional Limits for tasks assessed                                        Exercises     General Comments        Pertinent Vitals/Pain Pain Assessment: 0-10 Pain Score: 4  Pain Location: R hip/thigh Pain Descriptors / Indicators: Aching;Sore Pain Intervention(s): Repositioned;Monitored during session    [place] expects to be discharged to:: Private residence Living Arrangements: Spouse/significant other Available Help at Discharge: Family Type of Home: Apartment([place]) Home Access: Stairs to enter;Level entry   Home Layout: One level Home Equipment: [employer] - 2 wheels;Bedside commode  Additional Comments: spouse works part-time, flexible hours. Discharging today to apartment here in [place] with elevator entry    Prior Function Level of Independence: Independent      Comments: very active   PT Goals (current goals can now be found in the care plan section) Acute Rehab PT Goals Patient Stated Goal: home. regain PLOF/independence PT Goal Formulation: With patient/family Time For Goal Achievement: 01/03/18 Potential to Achieve Goals: Good Progress towards PT goals: Progressing toward goals    Frequency    7X/week       PT Plan Current plan remains appropriate    Co-evaluation              AM-PAC PT "6 Clicks" Daily Activity  Outcome Measure  Difficulty turning over in bed (including adjusting bedclothes, sheets and blankets)?: A Little Difficulty moving from lying on back to sitting on the side of the bed? : A Little Difficulty sitting down on and standing up from a chair with arms (e.g., wheelchair, bedside commode, etc,.)?: A Little Help needed moving to and from a bed to chair (including a wheelchair)?: A Little Help needed walking in hospital room?: A Little Help needed climbing 3-5 steps with a railing? : A Lot 6 Click Score: 17    End of Session Equipment Utilized During Treatment: Gait belt Activity Tolerance: Patient tolerated treatment well Patient left: in bed;with call bell/phone within reach   PT Visit Diagnosis: Difficulty in walking, not elsewhere classified (R26.2);Pain Pain - Right/Left: Right Pain - part of body: Hip     Time: 2924-4628 PT Time Calculation (min) (ACUTE ONLY): 17 min  Charges:  $Gait Training: 8-22 mins                    G Codes:          Weston Anna, MPT Pager: [phone]

## 2017-12-20 NOTE — Progress Notes (Signed)
Provided discharge instructions to patient.  Patient verbalized understanding.  No questions/concerns at this time.  Pt discharging to home with all equipment and belongings.

## 2017-12-22 NOTE — Discharge Summary (Signed)
Physician Discharge Summary  Patient ID: SHEY YOTT MRN: [id number] DOB/AGE: [birth year] 67 y.o.  Admit date: 12/19/2017 Discharge date: 12/20/2017   Procedures:  Procedure(s) (LRB): HARDWARE REMOVAL RIGHT HIP, RIGHT TOTAL HIP ARTHROPLASTY ANTERIOR APPROACH (Right)  Attending Physician:  Dr. Paralee Cancel   Admission Diagnoses:   Right hip AVN / pain  Discharge Diagnoses:  Principal Problem:   S/P right THA, AA  Past Medical History:  Diagnosis Date  . Allergy   . Arthritis   . Blood transfusion without reported diagnosis   . Complication of anesthesia   . History of colon polyps 2014  . PONV (postoperative nausea and vomiting)   . Seasonal allergies     HPI:    Kaitlyn Spencer, 67 y.o. female, has a history of pain and functional disability in the right hip(s) due to arthritis and patient has failed non-surgical conservative treatments for greater than 12 weeks to include NSAID's and/or analgesics and activity modification.  Onset of symptoms was abrupt starting October 2018 with rapidlly worsening course since that time.The patient noted prior procedures of the hip to include ORIF on the right hip(s).  Patient currently rates pain in the right hip at 7 out of 10 with activity. Patient has night pain, worsening of pain with activity and weight bearing, trendelenberg gait, pain that interfers with activities of daily living and pain with passive range of motion. Patient has evidence of periarticular osteophytes, joint space narrowing and AVN by imaging studies. This condition presents safety issues increasing the risk of falls. There is no current active infection.  Risks, benefits and expectations were discussed with the patient.  Risks including but not limited to the risk of anesthesia, blood clots, nerve damage, blood vessel damage, failure of the prosthesis, infection and up to and including death.  Patient understand the risks, benefits and expectations and wishes to proceed with  surgery.   PCP: Prince Solian, MD   Discharged Condition: good  Hospital Course:  Patient underwent the above stated procedure on 12/19/2017. Patient tolerated the procedure well and brought to the recovery room in good condition and subsequently to the floor.  POD #1 BP: 94/59 ; Pulse: 54 ; Temp: 97.9 F (36.6 C) ; Resp: 54 Patient reports pain as mild at the moment.  Tough early post op period but recognizes that while laying in bed this am she feels better than she had prior to surgery.  Otherwise no events. Neurovascular intact and incision: dressing C/D/I.   LABS  Basename    HGB     10.6  HCT     32.7    Discharge Exam: General appearance: alert, cooperative and no distress Extremities: Homans sign is negative, no sign of DVT, no edema, redness or tenderness in the calves or thighs and no ulcers, gangrene or trophic changes  Disposition:  Home with follow up in 2 weeks   Follow-up Information    Paralee Cancel, MD. Schedule an appointment as soon as possible for a visit in 2 weeks.   Specialty:  Orthopedic Surgery Contact information: [address] [place] [address] [phone]           Discharge Instructions    Call MD / Call 911   Complete by:  As directed    If you experience chest pain or shortness of breath, CALL 911 and be transported to the hospital emergency room.  If you develope a fever above 101 F, pus (white drainage) or increased drainage  or redness at the wound, or calf pain, call your surgeon's office.   Change dressing   Complete by:  As directed    Maintain surgical dressing until follow up in the clinic. If the edges start to pull up, may reinforce with tape. If the dressing is no longer working, may remove and cover with gauze and tape, but must keep the area dry and clean.  Call with any questions or concerns.   Constipation Prevention   Complete by:  As directed    Drink plenty of fluids.  Prune juice may be helpful.   You may use a stool softener, such as Colace (over the counter) 100 mg twice a day.  Use MiraLax (over the counter) for constipation as needed.   Diet - low sodium heart healthy   Complete by:  As directed    Discharge instructions   Complete by:  As directed    Maintain surgical dressing until follow up in the clinic. If the edges start to pull up, may reinforce with tape. If the dressing is no longer working, may remove and cover with gauze and tape, but must keep the area dry and clean.  Follow up in 2 weeks at [facility]. Call with any questions or concerns.   Increase activity slowly as tolerated   Complete by:  As directed    Weight bearing as tolerated with assist device (walker, cane, etc) as directed, use it as long as suggested by your surgeon or therapist, typically at least 4-6 weeks.   TED hose   Complete by:  As directed    Use stockings (TED hose) for 2 weeks on both leg(s).  You may remove them at night for sleeping.      Allergies as of 12/20/2017      Reactions   Amoxicillin Other (See Comments)   blisters   Asa [aspirin] Nausea And Vomiting, Other (See Comments)   Stomach irritation   Doxycycline Other (See Comments)   Blisters on toes      Medication List    STOP taking these medications   acetaminophen 500 MG tablet Commonly known as:  TYLENOL   estradiol 0.5 MG tablet Commonly known as:  ESTRACE   pantoprazole 20 MG tablet Commonly known as:  PROTONIX   VAGIFEM 10 MCG Tabs vaginal tablet Generic drug:  Estradiol     TAKE these medications   b complex vitamins tablet Take 1 tablet by mouth daily.   CALCIUM 600 600 MG Tabs tablet Generic drug:  calcium carbonate Take 600 mg by mouth daily.   cetirizine 10 MG tablet Commonly known as:  ZYRTEC Take 10 mg by mouth daily.   docusate sodium 100 MG capsule Commonly known as:  COLACE Take 1 capsule (100 mg total) by mouth 2 (two) times daily.   ferrous sulfate 325 (65 FE) MG  tablet Commonly known as:  FERROUSUL Take 1 tablet (325 mg total) by mouth 3 (three) times daily with meals.   HYDROcodone-acetaminophen 7.5-325 MG tablet Commonly known as:  NORCO Take 1-2 tablets by mouth every 4 (four) hours as needed for moderate pain.   methocarbamol 500 MG tablet Commonly known as:  ROBAXIN Take 1 tablet (500 mg total) by mouth every 6 (six) hours as needed for muscle spasms.   ondansetron 4 MG tablet Commonly known as:  ZOFRAN Take 1 tablet (4 mg total) by mouth every 6 (six) hours as needed for nausea.   polyethylene glycol packet Commonly known as:  MIRALAX / GLYCOLAX Take 17 g by mouth 2 (two) times daily.   rivaroxaban 10 MG Tabs tablet Commonly known as:  XARELTO Take 1 tablet (10 mg total) by mouth daily.   VITAMIN D-1000 MAX ST 1000 units tablet Generic drug:  Cholecalciferol Take 2,000 Units by mouth daily.            Discharge Care Instructions  (From admission, onward)        Start     Ordered   12/20/17 0000  Change dressing    Comments:  Maintain surgical dressing until follow up in the clinic. If the edges start to pull up, may reinforce with tape. If the dressing is no longer working, may remove and cover with gauze and tape, but must keep the area dry and clean.  Call with any questions or concerns.   12/20/17 2633       Signed: West Pugh. Lilygrace Rodick   PA-C  12/22/2017, 10:57 AM

## 2018-01-12 DIAGNOSIS — J301 Allergic rhinitis due to pollen: Secondary | ICD-10-CM | POA: Diagnosis not present

## 2018-01-13 ENCOUNTER — Encounter: Payer: Self-pay | Admitting: *Deleted

## 2018-01-13 DIAGNOSIS — J3089 Other allergic rhinitis: Secondary | ICD-10-CM | POA: Diagnosis not present

## 2018-01-23 ENCOUNTER — Ambulatory Visit: Payer: Self-pay

## 2018-01-23 NOTE — Progress Notes (Signed)
Immunotherapy   Patient Details  Name: TANISH SINKLER MRN: [id number] Date of Birth: Nov 07, 1950  01/23/2018  Annebelle H Durflinger Mailed Red Vials Pollen-Dog and Mold-DM Following schedule: C  Frequency: once weekly @ 0.50 every 2 weeks Epi-Pen: Yes Patient receives injections at [facility] @ [address] [place] [address]   Ashley N Hicks 01/23/2018, 1:31 PM

## 2018-03-29 ENCOUNTER — Encounter: Payer: Self-pay | Admitting: *Deleted

## 2018-03-29 NOTE — Progress Notes (Signed)
Vials made. Exp: 03-30-19. hv 

## 2018-03-30 ENCOUNTER — Telehealth: Payer: Self-pay | Admitting: *Deleted

## 2018-03-30 DIAGNOSIS — J301 Allergic rhinitis due to pollen: Secondary | ICD-10-CM | POA: Diagnosis not present

## 2018-03-30 NOTE — Telephone Encounter (Signed)
Patient informed that she does not have an appointment.

## 2018-03-30 NOTE — Telephone Encounter (Signed)
Left message for patient to call the office. Please let patient know she does not have an appointment on Monday morning it is for [place] to mail out her vials.

## 2018-03-31 DIAGNOSIS — J3089 Other allergic rhinitis: Secondary | ICD-10-CM | POA: Diagnosis not present

## 2018-04-03 ENCOUNTER — Ambulatory Visit: Payer: Medicare Other

## 2018-04-03 ENCOUNTER — Encounter: Payer: Self-pay | Admitting: *Deleted

## 2018-04-03 NOTE — Progress Notes (Signed)
Immunotherapy   Patient Details  Name: Kaitlyn Spencer MRN: [id number] Date of Birth: 14-Oct-1950  04/03/2018  Kaitlyn Spencer Mailed Red Vials Pollen-Dog and Mold-DM Following schedule: C  Frequency: once weekly @ 0.50 every 2 weeks Epi-Pen: Yes Patient receives injections at [facility] @ [address] [place] [address]    Kaitlyn Spencer 04/03/2018, 12:06 PM

## 2018-04-18 DIAGNOSIS — L821 Other seborrheic keratosis: Secondary | ICD-10-CM | POA: Diagnosis not present

## 2018-04-18 DIAGNOSIS — D485 Neoplasm of uncertain behavior of skin: Secondary | ICD-10-CM | POA: Diagnosis not present

## 2018-04-18 DIAGNOSIS — D1801 Hemangioma of skin and subcutaneous tissue: Secondary | ICD-10-CM | POA: Diagnosis not present

## 2018-04-18 DIAGNOSIS — D225 Melanocytic nevi of trunk: Secondary | ICD-10-CM | POA: Diagnosis not present

## 2018-04-18 DIAGNOSIS — L820 Inflamed seborrheic keratosis: Secondary | ICD-10-CM | POA: Diagnosis not present

## 2018-07-17 DIAGNOSIS — J301 Allergic rhinitis due to pollen: Secondary | ICD-10-CM | POA: Diagnosis not present

## 2018-07-17 NOTE — Progress Notes (Signed)
Vials exp 07-18-19

## 2018-07-18 DIAGNOSIS — J3089 Other allergic rhinitis: Secondary | ICD-10-CM | POA: Diagnosis not present

## 2018-07-24 ENCOUNTER — Ambulatory Visit: Payer: Self-pay

## 2018-07-24 DIAGNOSIS — J309 Allergic rhinitis, unspecified: Secondary | ICD-10-CM

## 2018-07-24 NOTE — Progress Notes (Signed)
Immunotherapy   Patient Details  Name: MATILYN FEHRMAN MRN: [id number] Date of Birth: [birth year]  07/24/2018  Dajana H FlyntMailed Red Vials Pollen-Dog and Mold-DM Following schedule:C Frequency:once weekly @ 0.50 every 2 weeks Epi-Pen:Yes Patient receives injections at [facility] @ [address] [place] [address]    Ashley N Hicks 07/24/2018, 1:59 PM

## 2018-07-31 ENCOUNTER — Ambulatory Visit: Payer: Self-pay

## 2018-08-01 ENCOUNTER — Ambulatory Visit: Payer: Self-pay

## 2018-08-02 ENCOUNTER — Ambulatory Visit: Payer: Self-pay

## 2018-08-03 ENCOUNTER — Ambulatory Visit: Payer: Self-pay

## 2018-08-21 ENCOUNTER — Other Ambulatory Visit: Payer: Self-pay | Admitting: Internal Medicine

## 2018-08-21 DIAGNOSIS — Z1231 Encounter for screening mammogram for malignant neoplasm of breast: Secondary | ICD-10-CM

## 2018-09-18 ENCOUNTER — Ambulatory Visit: Payer: Medicare Other

## 2018-10-11 ENCOUNTER — Ambulatory Visit: Payer: Medicare Other

## 2018-11-07 ENCOUNTER — Ambulatory Visit: Payer: Medicare Other

## 2018-11-13 ENCOUNTER — Telehealth: Payer: Self-pay | Admitting: *Deleted

## 2018-11-13 NOTE — Progress Notes (Signed)
Vials exp 11-14-2019

## 2018-11-13 NOTE — Telephone Encounter (Signed)
Patient called stating that her doctor's office in [place] will be calling to request her new red vials. Patient was calling to give verbal permission to have new vials made. I called her physicians office at [facility] @ [address] [place] [address] and left a detailed voicemail asking for most recent shot records to be faxed to our office. An appointment has been made for 11/27/2018 to have red vials mailed out.

## 2018-11-14 DIAGNOSIS — J301 Allergic rhinitis due to pollen: Secondary | ICD-10-CM | POA: Diagnosis not present

## 2018-11-15 DIAGNOSIS — J3089 Other allergic rhinitis: Secondary | ICD-10-CM | POA: Diagnosis not present

## 2018-11-27 ENCOUNTER — Other Ambulatory Visit: Payer: Self-pay

## 2018-11-27 ENCOUNTER — Ambulatory Visit: Payer: Self-pay

## 2018-11-27 DIAGNOSIS — J309 Allergic rhinitis, unspecified: Secondary | ICD-10-CM

## 2018-11-27 NOTE — Progress Notes (Signed)
Immunotherapy   Patient Details  Name: Kaitlyn Spencer MRN: [id number] Date of Birth: 03/12/1951  11/27/2018  Harlym H Hindley Red vials with Pollen-Dog and Mold-DM have been mailed out . Patient received injections at [facility] @ [address] [place] [address].  Following schedule:  C Frequency: Once weekly then @ 0.50 every 3 weeks Epi-Pen: Yes Consent signed and patient instructions given.   Herbie Drape 11/27/2018, 1:53 PM

## 2018-12-20 ENCOUNTER — Ambulatory Visit: Payer: Medicare Other

## 2019-02-13 ENCOUNTER — Ambulatory Visit
Admission: RE | Admit: 2019-02-13 | Discharge: 2019-02-13 | Disposition: A | Discharge status: Home / Self Care | Payer: Medicare Other | Source: Ambulatory Visit | Attending: Internal Medicine | Admitting: Internal Medicine

## 2019-02-13 ENCOUNTER — Other Ambulatory Visit: Payer: Self-pay

## 2019-02-13 DIAGNOSIS — Z1231 Encounter for screening mammogram for malignant neoplasm of breast: Secondary | ICD-10-CM

## 2019-04-17 DIAGNOSIS — J301 Allergic rhinitis due to pollen: Secondary | ICD-10-CM | POA: Diagnosis not present

## 2019-04-18 ENCOUNTER — Telehealth: Payer: Self-pay

## 2019-04-18 DIAGNOSIS — J3089 Other allergic rhinitis: Secondary | ICD-10-CM | POA: Diagnosis not present

## 2019-04-18 NOTE — Progress Notes (Signed)
VIALS EXP 04-16-20

## 2019-04-18 NOTE — Telephone Encounter (Signed)
Okay. I moved the appointment to a Monday. Thank you for letting me know.

## 2019-04-18 NOTE — Telephone Encounter (Signed)
Patient needs red vials made. They will need to be mailed out to [facility] @ [address], [place] [address]. The office will fax the injection record to [place].

## 2019-04-18 NOTE — Telephone Encounter (Signed)
Lonn [place] this will need to be mailed out on a Monday due to Wednesday being close to weekend. But will be sent over next week to [place].

## 2019-04-18 NOTE — Progress Notes (Signed)
Reprint labels 

## 2019-04-23 NOTE — Telephone Encounter (Signed)
Shot records have been faxed over, they have been labeled and placed in bulk scanning.

## 2019-04-23 NOTE — Telephone Encounter (Signed)
Patient's doctor office faxed over shot records for Molds-DM but now Pollens-Dog, called patient's doctor office at [phone] and left a voicemail on the nurses line asking to please fax over the most recent shot record for Pollens-Dog.

## 2019-04-30 ENCOUNTER — Ambulatory Visit: Payer: Self-pay

## 2019-04-30 NOTE — Progress Notes (Signed)
Immunotherapy   Patient Details  Name: Kaitlyn Spencer MRN: [id number] Date of Birth: Nov 18, 1950  04/30/2019  Ellard Artis Al Red vials with Pollen-Dog and Mold-DM have been mailed out . Patient receives injections at [facility] @ [address], [place] [address].  Following schedule:  C Frequency: Once weekly then @ 0.50 every 3 weeks. Patient can go to every 4 weeks 11/2019. Epi-Pen: Yes Consent signed and patient instructions given.   Herbie Drape 04/30/2019, 8:33 AM

## 2019-05-02 ENCOUNTER — Ambulatory Visit: Payer: Self-pay

## 2019-08-10 ENCOUNTER — Ambulatory Visit: Payer: Medicare Other | Attending: Internal Medicine

## 2019-08-10 DIAGNOSIS — Z23 Encounter for immunization: Secondary | ICD-10-CM | POA: Insufficient documentation

## 2019-08-10 NOTE — Progress Notes (Signed)
   Covid-19 Vaccination Clinic  Name:  Kaitlyn Spencer    MRN: [id number] DOB: 11/05/1950  08/10/2019  Kaitlyn Spencer was observed post Covid-19 immunization for 15 minutes without incidence. She was provided with Vaccine Information Sheet and instruction to access the V-Safe system.   Kaitlyn Spencer was instructed to call 911 with any severe reactions post vaccine: Marland Kitchen Difficulty breathing  . Swelling of your face and throat  . A fast heartbeat  . A bad rash all over your body  . Dizziness and weakness    Immunizations Administered    Name Date Dose VIS Date Route   Pfizer COVID-19 Vaccine 08/10/2019  5:00 PM 0.3 mL 06/29/2019 Intramuscular   Manufacturer: [place]   Lot: [id number]   [place]: [id number]

## 2019-08-20 ENCOUNTER — Ambulatory Visit: Payer: Medicare Other

## 2019-08-22 ENCOUNTER — Telehealth: Payer: Self-pay

## 2019-08-22 NOTE — Telephone Encounter (Signed)
Received treatment records to order next set of vials. Vials have been ordered and patient is on the schedule to mail out vials however I noticed patient has not been seen since 06/03/2016. After speaking with Beth we are going to move forward with ordering her vials however patient will need an office visit as soon as possible before we order and mail her next set. Called and informed patient. Patient was driving and will call the office back to set up an office visit

## 2019-08-23 ENCOUNTER — Ambulatory Visit: Payer: Medicare Other

## 2019-08-23 ENCOUNTER — Ambulatory Visit: Payer: Medicare Other | Attending: Internal Medicine

## 2019-08-23 DIAGNOSIS — Z20822 Contact with and (suspected) exposure to covid-19: Secondary | ICD-10-CM

## 2019-08-23 DIAGNOSIS — J301 Allergic rhinitis due to pollen: Secondary | ICD-10-CM | POA: Diagnosis not present

## 2019-08-23 NOTE — Progress Notes (Signed)
VIALS EXP 08-22-20

## 2019-08-24 ENCOUNTER — Telehealth: Payer: Self-pay | Admitting: Allergy & Immunology

## 2019-08-24 LAB — NOVEL CORONAVIRUS, NAA: SARS-CoV-2, NAA: NOT DETECTED

## 2019-08-24 NOTE — Telephone Encounter (Signed)
Called patient to schedule office visit for insurance purposes, Kaitlyn Spencer said he will tell her to call back.

## 2019-08-27 DIAGNOSIS — J3089 Other allergic rhinitis: Secondary | ICD-10-CM | POA: Diagnosis not present

## 2019-08-31 ENCOUNTER — Ambulatory Visit: Payer: Medicare Other | Attending: Internal Medicine

## 2019-08-31 DIAGNOSIS — Z23 Encounter for immunization: Secondary | ICD-10-CM | POA: Insufficient documentation

## 2019-08-31 NOTE — Progress Notes (Signed)
   Covid-19 Vaccination Clinic  Name:  Kaitlyn Spencer    MRN: [id number] DOB: Jan 12, 1951  08/31/2019  [name] was observed post Covid-19 immunization for 15 minutes without incidence. She was provided with Vaccine Information Sheet and instruction to access the V-Safe system.   [name] was instructed to call 911 with any severe reactions post vaccine: Marland Kitchen Difficulty breathing  . Swelling of your face and throat  . A fast heartbeat  . A bad rash all over your body  . Dizziness and weakness    Immunizations Administered    Name Date Dose VIS Date Route   Pfizer COVID-19 Vaccine 08/31/2019  4:35 PM 0.3 mL 06/29/2019 Intramuscular   Manufacturer: [place]   Lot: [id number]   [place]: [id number]

## 2019-09-04 ENCOUNTER — Encounter: Payer: Self-pay | Admitting: Allergy & Immunology

## 2019-09-04 ENCOUNTER — Other Ambulatory Visit: Payer: Self-pay

## 2019-09-04 ENCOUNTER — Ambulatory Visit: Payer: Self-pay

## 2019-09-04 ENCOUNTER — Ambulatory Visit: Payer: Medicare Other | Admitting: Allergy & Immunology

## 2019-09-04 VITALS — BP 108/80 | HR 60 | Temp 98.2°F | Resp 16 | Ht 68.0 in | Wt 131.0 lb

## 2019-09-04 DIAGNOSIS — H6121 Impacted cerumen, right ear: Secondary | ICD-10-CM | POA: Diagnosis not present

## 2019-09-04 DIAGNOSIS — J309 Allergic rhinitis, unspecified: Secondary | ICD-10-CM

## 2019-09-04 DIAGNOSIS — J3089 Other allergic rhinitis: Secondary | ICD-10-CM

## 2019-09-04 DIAGNOSIS — J302 Other seasonal allergic rhinitis: Secondary | ICD-10-CM

## 2019-09-04 NOTE — Progress Notes (Addendum)
FOLLOW UP  Date of Service/Encounter:  09/04/19   Assessment:   Perennial and seasonal allergic rhinitis (grasses, ragweed, weeds, molds, dust mite, dog)  Plan/Recommendations:   1. Seasonal and perennial allergic rhinitis - We will continue with shots through Fall 2021.  - This will be just over 3 years of therapy. - Wax removed today from your right ear.  - Use a syringe and rinse with water + hydrogen peroxide as needed. - You can also use olive oil into the ear canals 3-4 times weekly to keep them lubricated.   2. Return in about 1 year (around 09/03/2020). This can be an in-person, a virtual Webex or a telephone follow up visit.  Subjective:   VELISHA WIEBER is a 69 y.o. female presenting today for follow up of  Chief Complaint  Patient presents with  . Allergic Rhinitis     Rhodie H Gadson has a history of the following: Patient Active Problem List   Diagnosis Date Noted  . 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 Femoral neck fracture ([place]) 05/12/2017  . Chronic nonseasonal allergic rhinitis due to fungal spores 06/03/2016    History obtained from: chart review and patient.  Bernestine is a 69 y.o. female presenting for a follow up visit. She was last seen as a New Patient in November 2017. At that time, she had testing that was positive to grasses, ragweed, weeds, molds, dog, and dust mite. We stopped the Flonase and start Dymista 1-2 sprays up to twice daily. She started allergy shots and receives them at [facility] in [place], [place]. This is where she lives most of the time.    In the interim, she has mostly done well. She did have a hip replacement in July 2019. This took several months to recover. She is no longer doing physical therapy. She is back to her normal level of activity. 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   Allergic Rhinitis Symptom History: She feels that she is  doing better with the shots. In particular, she has much less seasonal stuff.  She is planning to continue through the Fall 2021. She is not using any medications on a routine. She is not using any allergy medications at all.   Aashna is on allergen immunotherapy. She receives two injections. Immunotherapy script #1 contains molds and dust mites. She currently receives 0.65mL of the RED vial (1/100). Immunotherapy script #2 contains ragweed, weeds, grasses and dog. She currently receives 0.22mL of the RED vial (1/100). She started shots January of 2018 and reached maintenance in June of 2018. She has done very well with the allergy shots and has advanced per the schedule. She is now getting them monthly and feels very good with this schedule. She is wondering how long she is going to continue with them.  Otherwise, there have been no changes to her past medical history, surgical history, family history, or social history.    Review of Systems  Constitutional: Negative.  Negative for fever, malaise/fatigue and weight loss.  HENT: Negative.  Negative for congestion, ear discharge and ear pain.   Eyes: Negative for pain, discharge and redness.  Respiratory: Negative for cough, sputum production, shortness of breath and wheezing.   Cardiovascular: Negative.  Negative for chest pain and palpitations.  Gastrointestinal: Negative for abdominal pain, constipation, diarrhea, heartburn, nausea and vomiting.  Skin: Negative.  Negative for  itching and rash.  Neurological: Negative for dizziness and headaches.  Endo/Heme/Allergies: Negative for environmental allergies. Does not bruise/bleed easily.       Objective:   Blood pressure 108/80, pulse 60, temperature 98.2 F (36.8 C), temperature source Temporal, resp. rate 16, height 5\' 8"  (1.727 m), weight 131 lb (59.4 kg), SpO2 98 %. Body mass index is 19.92 kg/m.   Physical Exam:  Physical Exam  Constitutional: She appears well-developed.  HENT:    Head: Normocephalic and atraumatic.  Right Ear: Tympanic membrane, external ear and ear canal normal.  Left Ear: Tympanic membrane, external ear and ear canal normal.  Nose: Mucosal edema and rhinorrhea present. No nasal deformity or septal deviation. No epistaxis. Right sinus exhibits no maxillary sinus tenderness and no frontal sinus tenderness. Left sinus exhibits no maxillary sinus tenderness and no frontal sinus tenderness.  Mouth/Throat: Uvula is midline and oropharynx is clear and moist. Mucous membranes are not pale and not dry.  There was cerumen within the right external auditory canal. This was removed via instrumentation. Patient tolerated procedure well. There was no blood loss.  Eyes: Pupils are equal, round, and reactive to light. Conjunctivae and EOM are normal. Right eye exhibits no chemosis and no discharge. Left eye exhibits no chemosis and no discharge. Right conjunctiva is not injected. Left conjunctiva is not injected.  Cardiovascular: Normal rate, regular rhythm and normal heart sounds.  Respiratory: Effort normal and breath sounds normal. No accessory muscle usage. No tachypnea. No respiratory distress. She has no wheezes. She has no rhonchi. She has no rales. She exhibits no tenderness.  Moving air well in all lung fields. No increased work of breathing noted.   Lymphadenopathy:    She has no cervical adenopathy.  Neurological: She is alert.  Skin: No abrasion, no petechiae and no rash noted. Rash is not papular, not vesicular and not urticarial. No erythema. No pallor.  Psychiatric: She has a normal mood and affect.     Diagnostic studies: none        Salvatore Marvel, MD  [facility]

## 2019-09-04 NOTE — Patient Instructions (Addendum)
1. Seasonal and perennial allergic rhinitis - We will continue with shots through Fall 2021.  - This will be just over 3 years of therapy. - Wax removed today from your right ear.  - Use a syringe and rinse with water + hydrogen peroxide as needed. - You can also use olive oil into the ear canals 3-4 times weekly to keep them lubricated.   2. Return in about 1 year (around 09/03/2020). This can be an in-person, a virtual Webex or a telephone follow up visit.   Please inform us of any Emergency Department visits, hospitalizations, or changes in symptoms. Call us before going to the ED for breathing or allergy symptoms since we might be able to fit you in for a sick visit. Feel free to contact us anytime with any questions, problems, or concerns.  It was a pleasure to see you again today!  Websites that have reliable patient information: 1. American Academy of Asthma, Allergy, and Immunology: www.aaaai.org 2. Food Allergy Research and Education (FARE): foodallergy.org 3. Mothers of Asthmatics: http://www.asthmacommunitynetwork.org 4. American College of Allergy, Asthma, and Immunology: www.acaai.org   COVID-19 Vaccine Information can be found at: ShippingScam.co.uk For questions related to vaccine distribution or appointments, please email vaccine@[facility] .com or call [phone].     "Like" [place] on Facebook and Instagram for our latest updates!        Make sure you are registered to vote! If you have moved or changed any of your contact information, you will need to get this updated before voting!  In some cases, you MAY be able to register to vote online: [email or web address]

## 2019-09-04 NOTE — Progress Notes (Signed)
Immunotherapy   Patient Details  Name: Kaitlyn Spencer MRN: [id number] Date of Birth: 01-31-1951  09/04/2019  Baily H Riester Red 1:100 vials  with Pollen-Dog and Mold-DM have been mailed out . Patient receives injections at [facility], [address] [place] [address].  Following schedule:  C Frequency: Once weekly then @ 0.50 every 3 weeks Epi-Pen: Yes Consent signed and patient instructions given.   Cathi Roan 09/04/2019, 11:34 AM

## 2019-11-19 ENCOUNTER — Other Ambulatory Visit: Payer: Self-pay | Admitting: Internal Medicine

## 2019-11-19 DIAGNOSIS — Z1231 Encounter for screening mammogram for malignant neoplasm of breast: Secondary | ICD-10-CM

## 2019-12-26 DIAGNOSIS — J301 Allergic rhinitis due to pollen: Secondary | ICD-10-CM | POA: Diagnosis not present

## 2019-12-26 NOTE — Progress Notes (Signed)
VIALS EXP 12-25-20

## 2019-12-27 DIAGNOSIS — J3089 Other allergic rhinitis: Secondary | ICD-10-CM

## 2020-01-14 ENCOUNTER — Ambulatory Visit: Payer: Self-pay

## 2020-01-14 NOTE — Progress Notes (Deleted)
Immunotherapy   Patient Details  Name: Kaitlyn Spencer MRN: [id number] Date of Birth: [birth year]  01/14/2020  [name] 1:100 vials  withPollen-Dog and Mold-DMhave been mailed out . Patient receives injections at [facility], [address], [place] [address]. Following schedule:C Frequency:Once weekly then @ 0.50 every 3 weeks Epi-Pen:Yes  Consent signed and patient instructions given.   [name] 01/14/2020, 10:08 AM

## 2020-02-14 ENCOUNTER — Ambulatory Visit: Payer: Medicare Other

## 2020-03-07 ENCOUNTER — Other Ambulatory Visit: Payer: Self-pay

## 2020-03-07 ENCOUNTER — Ambulatory Visit
Admission: RE | Admit: 2020-03-07 | Discharge: 2020-03-07 | Disposition: A | Discharge status: Home / Self Care | Payer: Medicare Other | Source: Ambulatory Visit | Attending: Internal Medicine | Admitting: Internal Medicine

## 2020-03-07 DIAGNOSIS — Z1231 Encounter for screening mammogram for malignant neoplasm of breast: Secondary | ICD-10-CM

## 2020-03-25 ENCOUNTER — Ambulatory Visit: Payer: Medicare Other | Attending: Internal Medicine

## 2020-03-25 DIAGNOSIS — Z23 Encounter for immunization: Secondary | ICD-10-CM

## 2020-03-25 NOTE — Progress Notes (Signed)
   Covid-19 Vaccination Clinic  Name:  Kealey Kemmer    MRN: [id number] DOB: 03-29-1951  03/25/2020  Ms. Gitlin was observed post Covid-19 immunization for 15 minutes without incident. She was provided with Vaccine Information Sheet and instruction to access the V-Safe system.   Ms. Berling was instructed to call 911 with any severe reactions post vaccine: Marland Kitchen Difficulty breathing  . Swelling of face and throat  . A fast heartbeat  . A bad rash all over body  . Dizziness and weakness

## 2021-03-03 ENCOUNTER — Encounter: Payer: Self-pay | Admitting: Gastroenterology

## 2021-03-30 ENCOUNTER — Encounter: Payer: Self-pay | Admitting: Gastroenterology

## 2021-05-22 ENCOUNTER — Ambulatory Visit (AMBULATORY_SURGERY_CENTER): Payer: Medicare Other | Admitting: *Deleted

## 2021-05-22 ENCOUNTER — Encounter: Payer: Self-pay | Admitting: Gastroenterology

## 2021-05-22 ENCOUNTER — Other Ambulatory Visit: Payer: Self-pay

## 2021-05-22 VITALS — Ht 69.0 in | Wt 127.0 lb

## 2021-05-22 DIAGNOSIS — Z86010 Personal history of colonic polyps: Secondary | ICD-10-CM

## 2021-05-22 MED ORDER — PLENVU 140 G PO SOLR: 1.0000 | Freq: Once | ORAL | 0 refills | Status: AC

## 2021-05-22 NOTE — Progress Notes (Signed)
Patient's pre-visit was done today over the phone with the patient due to COVID-19 pandemic. Name,DOB and address verified. Patient denies any allergies to Eggs and Soy. Patient denies any problems with anesthesia/sedation. H/O PONV. Patient is not taking any diet pills or blood thinners. No home Oxygen. Packet of Prep instructions mailed to patient including a copy of a consent form-pt is aware. Patient understands to call us back with any questions or concerns. Patient is aware of our care-partner policy and [id number] safety protocol.    The patient is COVID-19 vaccinated.

## 2021-05-26 ENCOUNTER — Encounter: Payer: Medicare Other | Admitting: Vascular Surgery

## 2021-06-05 ENCOUNTER — Other Ambulatory Visit: Payer: Self-pay

## 2021-06-05 ENCOUNTER — Encounter: Payer: Self-pay | Admitting: Gastroenterology

## 2021-06-05 ENCOUNTER — Ambulatory Visit (AMBULATORY_SURGERY_CENTER): Payer: Medicare Other | Admitting: Gastroenterology

## 2021-06-05 VITALS — BP 109/70 | HR 46 | Temp 95.6°F | Resp 16 | Ht 69.0 in | Wt 127.0 lb

## 2021-06-05 DIAGNOSIS — Z86010 Personal history of colonic polyps: Secondary | ICD-10-CM | POA: Diagnosis not present

## 2021-06-05 DIAGNOSIS — D122 Benign neoplasm of ascending colon: Secondary | ICD-10-CM

## 2021-06-05 DIAGNOSIS — D123 Benign neoplasm of transverse colon: Secondary | ICD-10-CM

## 2021-06-05 MED ORDER — SODIUM CHLORIDE 0.9 % IV SOLN: 500.0000 mL | Freq: Once | INTRAVENOUS | Status: DC

## 2021-06-05 NOTE — Progress Notes (Signed)
A and O x3. Report to RN. Tolerated MAC anesthesia well. 

## 2021-06-05 NOTE — Progress Notes (Signed)
Pt's states no medical or surgical changes since previsit or office visit.  [facility] [place] 

## 2021-06-05 NOTE — Progress Notes (Signed)
[facility] History and Physical   Primary Care Physician:  Prince Solian, MD   Reason for Procedure:   History of colon polyps  Plan:    colonoscopy     HPI: Kaitlyn Spencer is a 70 y.o. female  here for colonoscopy surveillance - advanced SSP removed 09/2017. Patient denies any bowel symptoms at this time. No family history of colon cancer known. Otherwise feels well without any cardiopulmonary symptoms.    Past Medical History:  Diagnosis Date   Allergy    Arthritis    Blood transfusion without reported diagnosis    Complication of anesthesia    PONV   History of colon polyps 2014   PONV (postoperative nausea and vomiting)    Seasonal allergies     Past Surgical History:  Procedure Laterality Date   ABDOMINAL HYSTERECTOMY  1998   BREAST REDUCTION SURGERY  2005   COLONOSCOPY     COLONOSCOPY WITH PROPOFOL  2019   Dr.Nakoa Ganus   HIP PINNING,CANNULATED Right 05/12/2017   Procedure: RIGHT CANNULATED HIP PINNING;  Surgeon: Altamese Waterford, MD;  Location: [facility];  Service: Orthopedics;  Laterality: Right;   POLYPECTOMY     REDUCTION MAMMAPLASTY Bilateral    SPINAL FUSION  1985   TOTAL HIP ARTHROPLASTY Right 12/19/2017   Procedure: HARDWARE REMOVAL RIGHT HIP, RIGHT TOTAL HIP ARTHROPLASTY ANTERIOR APPROACH;  Surgeon: Paralee Cancel, MD;  Location: [facility];  Service: Orthopedics;  Laterality: Right;    Prior to Admission medications   Medication Sig Start Date End Date Taking? Authorizing Provider  b complex vitamins tablet Take 1 tablet by mouth daily.   Yes [provider]  calcium carbonate (OS-CAL) 600 MG TABS tablet Take 600 mg by mouth daily.    Yes [provider]  Cholecalciferol (VITAMIN D-1000 MAX ST) 1000 units tablet Take 2,000 Units by mouth daily.    Yes [provider]  estradiol (ESTRACE) 0.5 MG tablet Take 0.5 mg by mouth daily. 08/02/19  Yes [provider]  Estradiol 10 MCG TABS vaginal tablet SMARTSIG:1  Tablet(s) Vaginal 2-3 Times Weekly 08/15/19  Yes [provider]  cetirizine (ZYRTEC) 10 MG tablet Take 10 mg by mouth daily.    [provider]  triamcinolone (KENALOG) 0.1 % paste APPLY THIN FILM AFFECTED AREA 06/18/19   [provider]  zolpidem (AMBIEN) 10 MG tablet Take 10 mg by mouth at bedtime as needed. 02/13/21   [provider]    Current Outpatient Medications  Medication Sig Dispense Refill   b complex vitamins tablet Take 1 tablet by mouth daily.     calcium carbonate (OS-CAL) 600 MG TABS tablet Take 600 mg by mouth daily.      Cholecalciferol (VITAMIN D-1000 MAX ST) 1000 units tablet Take 2,000 Units by mouth daily.      estradiol (ESTRACE) 0.5 MG tablet Take 0.5 mg by mouth daily.     Estradiol 10 MCG TABS vaginal tablet SMARTSIG:1 Tablet(s) Vaginal 2-3 Times Weekly     cetirizine (ZYRTEC) 10 MG tablet Take 10 mg by mouth daily.     triamcinolone (KENALOG) 0.1 % paste APPLY THIN FILM AFFECTED AREA     zolpidem (AMBIEN) 10 MG tablet Take 10 mg by mouth at bedtime as needed.     Current Facility-Administered Medications  Medication Dose Route Frequency Provider Last Rate Last Admin   0.9 %  sodium chloride infusion  500 mL Intravenous Once Robertlee Rogacki, Carlota Raspberry, MD        Allergies  as of 06/05/2021 - Review Complete 06/05/2021  Allergen Reaction Noted   Amoxicillin Other (See Comments) 05/30/2013   Asa [aspirin] Nausea And Vomiting and Other (See Comments) 05/30/2013   Doxycycline Rash and Other (See Comments) 05/12/2017    Family History  Problem Relation Age of Onset   Allergic rhinitis Father    Liver disease Father    Alzheimer's disease Mother    Hypothyroidism Mother    Osteoporosis Mother    Colon cancer Neg Hx    Asthma Neg Hx    Atopy Neg Hx    Eczema Neg Hx    Immunodeficiency Neg Hx    Urticaria Neg Hx    Esophageal cancer Neg Hx    Rectal cancer Neg Hx    Stomach cancer Neg Hx     Social History   Socioeconomic  History   Marital status: Married    Spouse name: Not on file   Number of children: Not on file   Years of education: Not on file   Highest education level: Not on file  Occupational History   Not on file  Tobacco Use   Smoking status: Never   Smokeless tobacco: Never  Vaping Use   Vaping Use: Never used  Substance and Sexual Activity   Alcohol use: Yes    Alcohol/week: 6.0 standard drinks    Types: 6 Glasses of wine per week    Comment: social-wine   Drug use: No   Sexual activity: Not on file  Other Topics Concern   Not on file  Social History Narrative   Not on file   Social Determinants of Health   Financial Resource Strain: Not on file  Food Insecurity: Not on file  Transportation Needs: Not on file  Physical Activity: Not on file  Stress: Not on file  Social Connections: Not on file  Intimate Partner Violence: Not on file    Review of Systems: All other review of systems negative except as mentioned in the HPI.  Physical Exam: Vital signs BP 113/76   Pulse (!) 57   Temp (!) 95.6 F (35.3 C) (Temporal)   Resp 16   Ht 5\' 9"  (1.753 m)   Wt 127 lb (57.6 kg)   SpO2 97%   BMI 18.75 kg/m   General:   Alert,  Well-developed, pleasant and cooperative in NAD Lungs:  Clear throughout to auscultation.   Heart:  Regular rate and rhythm Abdomen:  Soft, nontender and nondistended.   Neuro/Psych:  Alert and cooperative. Normal mood and affect. A and O x 3  Jolly Mango, MD [facility]

## 2021-06-05 NOTE — Patient Instructions (Signed)
Information on polyps and diverticulosis given to you today.  Await pathology results.  Resume previous diet and medications.  YOU HAD AN ENDOSCOPIC PROCEDURE TODAY AT THE [facility]:   Refer to the procedure report that was given to you for any specific questions about what was found during the examination.  If the procedure report does not answer your questions, please call your gastroenterologist to clarify.  If you requested that your care partner not be given the details of your procedure findings, then the procedure report has been included in a sealed envelope for you to review at your convenience later.  YOU SHOULD EXPECT: Some feelings of bloating in the abdomen. Passage of more gas than usual.  Walking can help get rid of the air that was put into your GI tract during the procedure and reduce the bloating. If you had a lower endoscopy (such as a colonoscopy or flexible sigmoidoscopy) you may notice spotting of blood in your stool or on the toilet paper. If you underwent a bowel prep for your procedure, you may not have a normal bowel movement for a few days.  Please Note:  You might notice some irritation and congestion in your nose or some drainage.  This is from the oxygen used during your procedure.  There is no need for concern and it should clear up in a day or so.  SYMPTOMS TO REPORT IMMEDIATELY:   Following lower endoscopy (colonoscopy or flexible sigmoidoscopy):  Excessive amounts of blood in the stool  Significant tenderness or worsening of abdominal pains  Swelling of the abdomen that is new, acute  Fever of 100F or higher   For urgent or emergent issues, a gastroenterologist can be reached at any hour by calling [phone]. Do not use MyChart messaging for urgent concerns.    DIET:  We do recommend a small meal at first, but then you may proceed to your regular diet.  Drink plenty of fluids but you should avoid alcoholic beverages for 24  hours.  ACTIVITY:  You should plan to take it easy for the rest of today and you should NOT DRIVE or use heavy machinery until tomorrow (because of the sedation medicines used during the test).    FOLLOW UP: Our staff will call the number listed on your records 48-72 hours following your procedure to check on you and address any questions or concerns that you may have regarding the information given to you following your procedure. If we do not reach you, we will leave a message.  We will attempt to reach you two times.  During this call, we will ask if you have developed any symptoms of COVID 19. If you develop any symptoms (ie: fever, flu-like symptoms, shortness of breath, cough etc.) before then, please call [phone].  If you test positive for Covid 19 in the 2 weeks post procedure, please call and report this information to us.    If any biopsies were taken you will be contacted by phone or by letter within the next 1-3 weeks.  Please call us at [phone] if you have not heard about the biopsies in 3 weeks.    SIGNATURES/CONFIDENTIALITY: You and/or your care partner have signed paperwork which will be entered into your electronic medical record.  These signatures attest to the fact that that the information above on your After Visit Summary has been reviewed and is understood.  Full responsibility of the confidentiality of this discharge information lies with you and/or   your care-partner. 

## 2021-06-05 NOTE — Op Note (Signed)
[place] Patient Name: Kaitlyn Spencer Procedure Date: 06/05/2021 8:21 AM MRN: [id number] Endoscopist: Remo Lipps P. Havery Moros , MD Age: 69 Referring MD:  Date of Birth: [birth year] Gender: Female Account #: [id number] Procedure:                Colonoscopy Indications:              High risk colon cancer surveillance: Personal                            history of colonic polyps (history of 1cm SSP and                            TAs removed 09/2017) Medicines:                Monitored Anesthesia Care Procedure:                Pre-Anesthesia Assessment:                           - Prior to the procedure, a History and Physical                            was performed, and patient medications and                            allergies were reviewed. The patient's tolerance of                            previous anesthesia was also reviewed. The risks                            and benefits of the procedure and the sedation                            options and risks were discussed with the patient.                            All questions were answered, and informed consent                            was obtained. Prior Anticoagulants: The patient has                            taken no previous anticoagulant or antiplatelet                            agents. ASA Grade Assessment: II - A patient with                            mild systemic disease. After reviewing the risks                            and benefits, the patient was deemed in  satisfactory condition to undergo the procedure.                           After obtaining informed consent, the colonoscope                            was passed under direct vision. Throughout the                            procedure, the patient's blood pressure, pulse, and                            oxygen saturations were monitored continuously. The                            Olympus [id number]-[id number] (#[id number])  Colonoscope was                            introduced through the anus and advanced to the the                            cecum, identified by appendiceal orifice and                            ileocecal valve. The colonoscopy was performed                            without difficulty. The patient tolerated the                            procedure well. The quality of the bowel                            preparation was good. The ileocecal valve,                            appendiceal orifice, and rectum were photographed. Scope In: 8:28:00 AM Scope Out: 8:51:42 AM Scope Withdrawal Time: 0 hours 16 minutes 15 seconds  Total Procedure Duration: 0 hours 23 minutes 42 seconds  Findings:                 The perianal and digital rectal examinations were                            normal.                           A single angiodysplastic lesion was found in the                            cecum.                           The colon was tortuous.  Two sessile polyps were found in the ascending                            colon. The polyps were 3 to 4 mm in size. These                            polyps were removed with a cold snare. Resection                            and retrieval were complete.                           A 4 mm polyp was found in the transverse colon. The                            polyp was sessile. The polyp was removed with a                            cold snare. Resection and retrieval were complete.                           A few small-mouthed diverticula were found in the                            transverse colon.                           The exam was otherwise without abnormality. Complications:            No immediate complications. Estimated blood loss:                            Minimal. Estimated Blood Loss:     Estimated blood loss was minimal. Impression:               - A single colonic angiodysplastic lesion.                            - Tortuous colon.                           - Two 3 to 4 mm polyps in the ascending colon,                            removed with a cold snare. Resected and retrieved.                           - One 4 mm polyp in the transverse colon, removed                            with a cold snare. Resected and retrieved.                           - Diverticulosis in the transverse colon.                           -  The examination was otherwise normal. Recommendation:           - Patient has a contact number available for                            emergencies. The signs and symptoms of potential                            delayed complications were discussed with the                            patient. Return to normal activities tomorrow.                            Written discharge instructions were provided to the                            patient.                           - Resume previous diet.                           - Continue present medications.                           - Await pathology results. Anticipate repeat                            colonoscopy in 5 years. Remo Lipps P. Livingston Denner, MD 06/05/2021 8:56:34 AM This report has been signed electronically.

## 2021-06-05 NOTE — Progress Notes (Signed)
Called to room to assist during endoscopic procedure.  Patient ID and intended procedure confirmed with present staff. Received instructions for my participation in the procedure from the performing physician.  

## 2021-06-09 ENCOUNTER — Telehealth: Payer: Self-pay | Admitting: *Deleted

## 2021-06-09 NOTE — Telephone Encounter (Signed)
  Follow up Call-  Call back number 06/05/2021  Post procedure Call Back phone  # [phone]  Permission to leave phone message Yes  Some recent data might be hidden     Patient questions:  Do you have a fever, pain , or abdominal swelling? No. Pain Score  0 *  Have you tolerated food without any problems? Yes.    Have you been able to return to your normal activities? Yes.    Do you have any questions about your discharge instructions: Diet    Medications  No. Follow up visit  No.  Do you have questions or concerns about your Care? No.  Actions: * If pain score is 4 or above: No action needed, pain <4.  Have you developed a fever since your procedure? no  2.   Have you had an respiratory symptoms (SOB or cough) since your procedure? no  3.   Have you tested positive for COVID 19 since your procedure no  4.   Have you had any family members/close contacts diagnosed with the COVID 19 since your procedure?  no   If yes to any of these questions please route to Joylene John, RN and Joella Prince, RN

## 2021-06-17 ENCOUNTER — Other Ambulatory Visit: Payer: Self-pay | Admitting: Internal Medicine

## 2021-06-17 DIAGNOSIS — Z1231 Encounter for screening mammogram for malignant neoplasm of breast: Secondary | ICD-10-CM

## 2021-08-14 ENCOUNTER — Ambulatory Visit: Payer: Medicare Other

## 2021-08-20 ENCOUNTER — Ambulatory Visit: Payer: Medicare Other

## 2021-08-27 ENCOUNTER — Ambulatory Visit
Admission: RE | Admit: 2021-08-27 | Discharge: 2021-08-27 | Disposition: A | Discharge status: Home / Self Care | Payer: Medicare Other | Source: Ambulatory Visit | Attending: Internal Medicine | Admitting: Internal Medicine

## 2021-08-27 ENCOUNTER — Other Ambulatory Visit: Payer: Self-pay

## 2021-08-27 DIAGNOSIS — Z1231 Encounter for screening mammogram for malignant neoplasm of breast: Secondary | ICD-10-CM

## 2022-08-18 ENCOUNTER — Telehealth: Payer: Self-pay

## 2022-08-18 NOTE — Telephone Encounter (Signed)
Pt called with questions about laser for small spider veins. She has had injections in the past that did not work well and felt laser worked better. She is not interested in proceeding with injections at this time.

## 2022-09-27 ENCOUNTER — Other Ambulatory Visit: Payer: Self-pay | Admitting: Internal Medicine

## 2022-09-27 DIAGNOSIS — Z1231 Encounter for screening mammogram for malignant neoplasm of breast: Secondary | ICD-10-CM

## 2022-10-24 IMAGING — MG MM DIGITAL SCREENING BILAT W/ TOMO AND CAD
8 series · 9 of 24 positions shown · non-contrast
Comparison: Previous exam(s).

CLINICAL DATA: Screening.

EXAM:
DIGITAL SCREENING BILATERAL MAMMOGRAM WITH TOMOSYNTHESIS AND CAD
TECHNIQUE: Bilateral screening digital craniocaudal and mediolateral oblique
mammograms were obtained. Bilateral screening digital breast
tomosynthesis was performed. The images were evaluated with
computer-aided detection.

[L CC synth-2D]
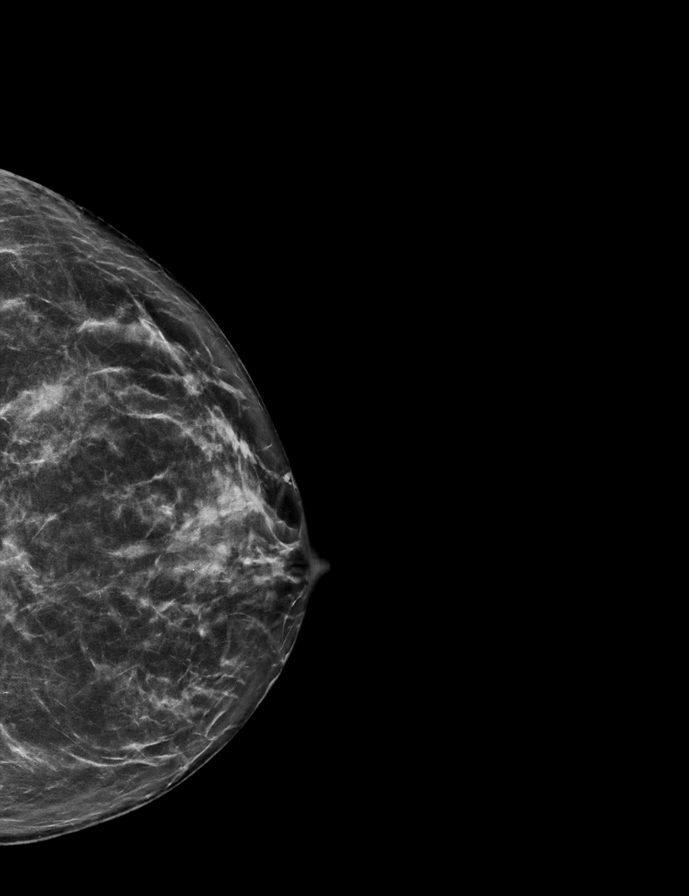

[R MLO synth-2D]
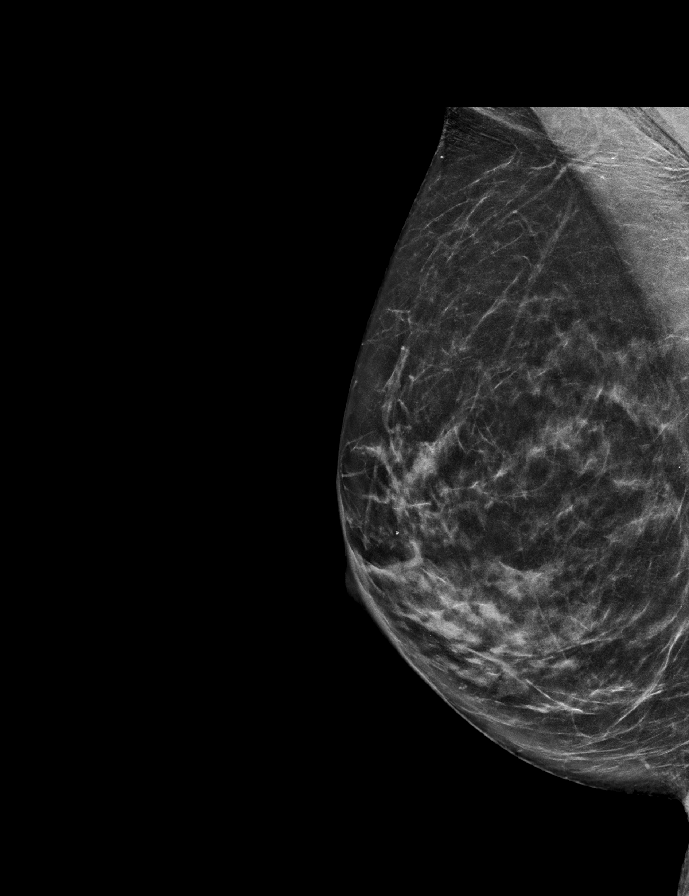

[R CC synth-2D]
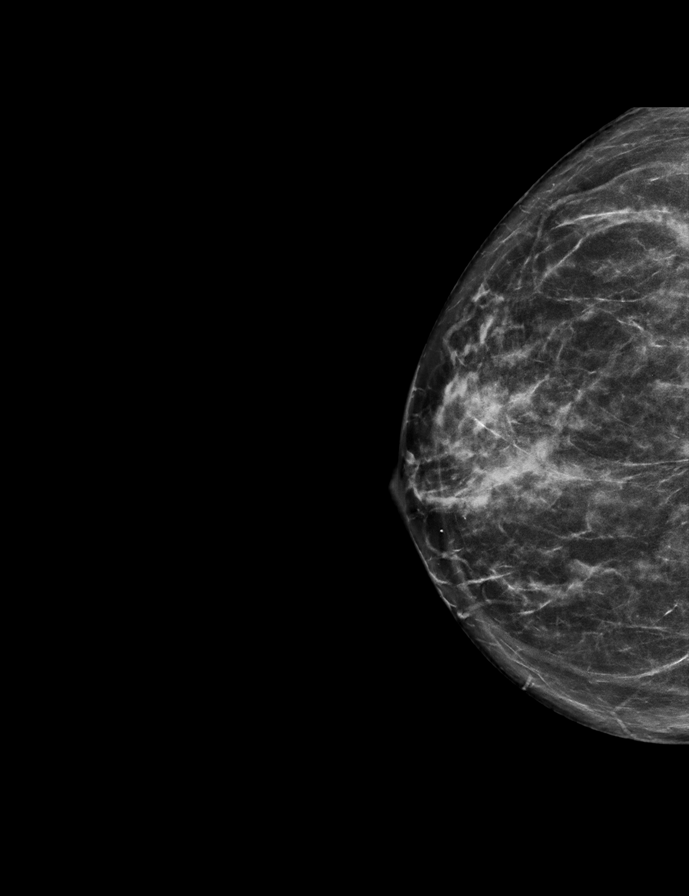

[L MLO synth-2D]
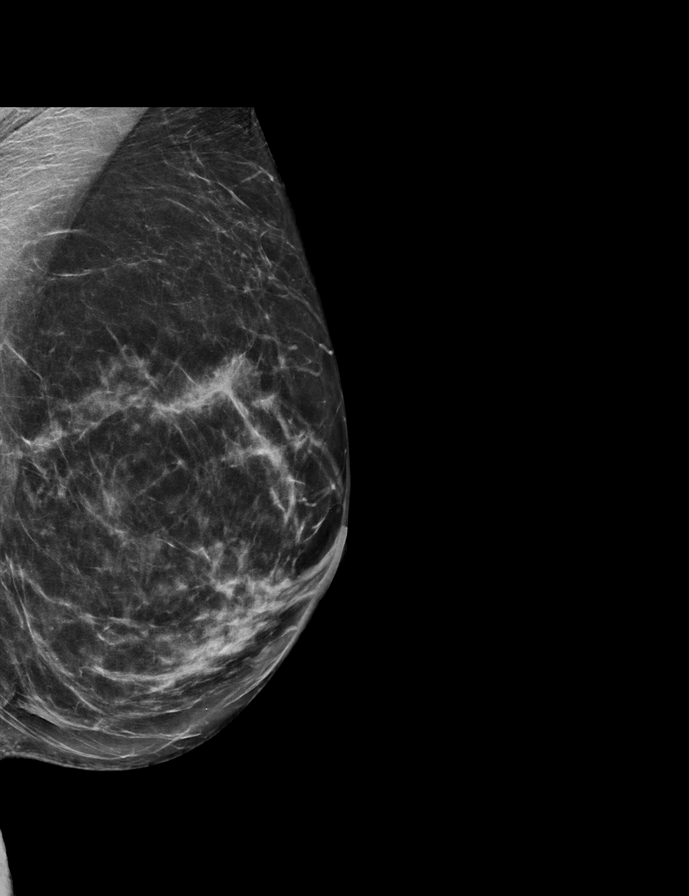

[L CC tomo · 2 of 64 frames shown]
[frame 21/64]
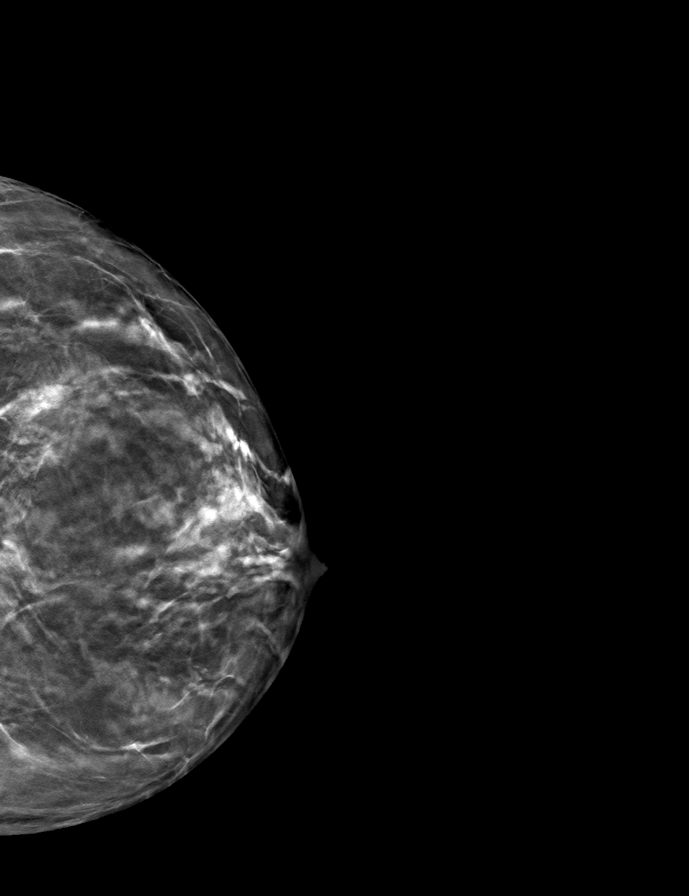
[frame 33/64]
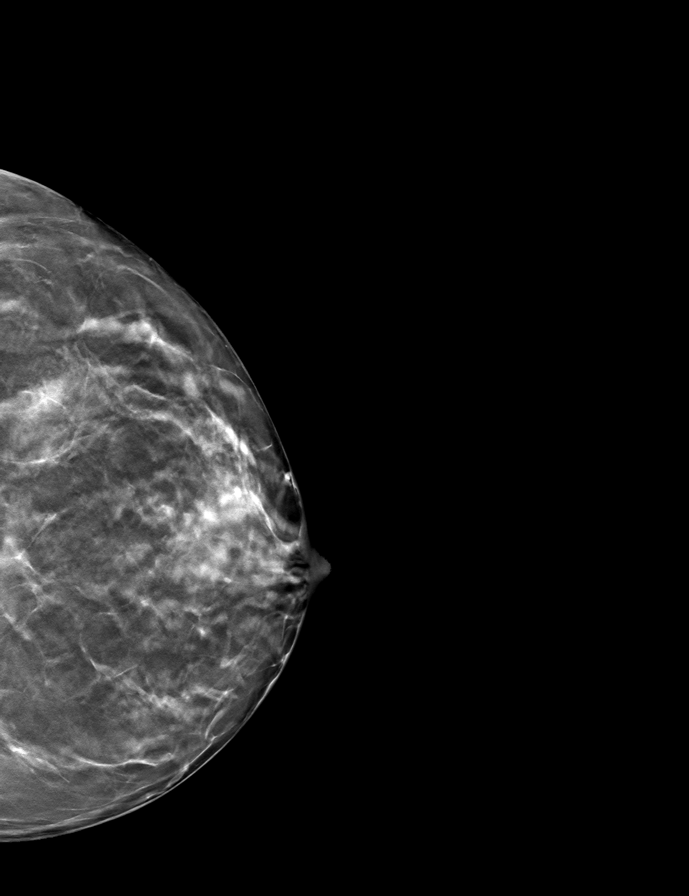

[R CC tomo · tomo slice 31/62.0]
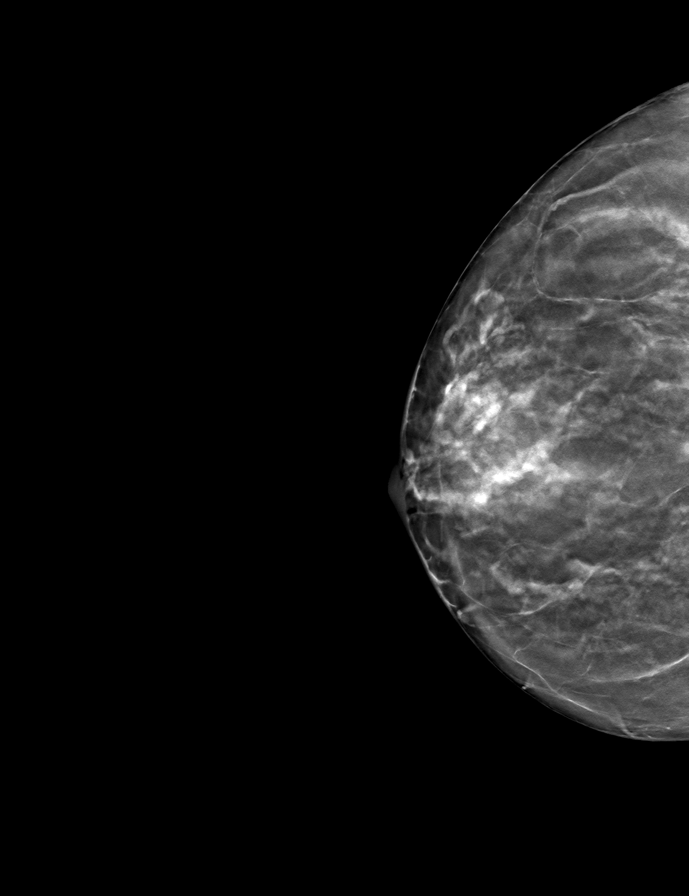

[L MLO tomo · tomo slice 33/66.0]
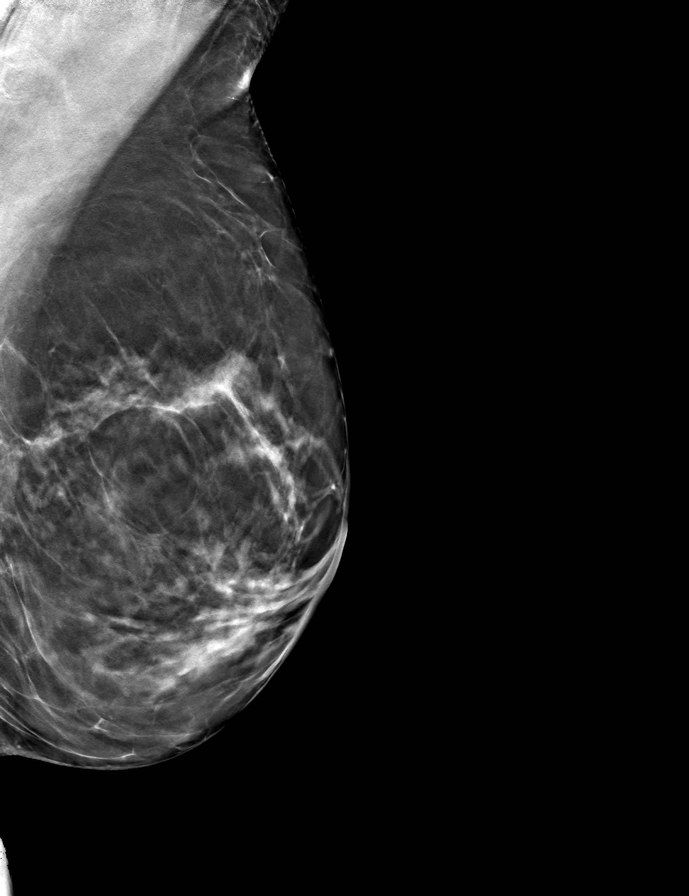

[R MLO tomo · tomo slice 31/60.0]
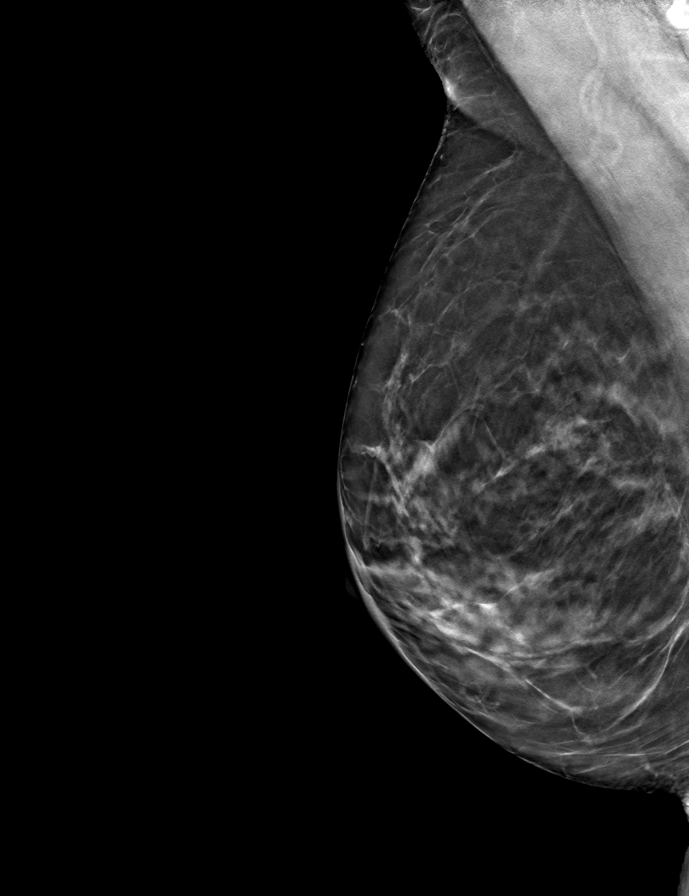

[9 of 24 positions shown; findings below may reference images not displayed]

ACR Breast Density Category c: The breast tissue is heterogeneously
dense, which may obscure small masses.
FINDINGS: There are no findings suspicious for malignancy.
IMPRESSION: No mammographic evidence of malignancy. A result letter of this
screening mammogram will be mailed directly to the patient.

RECOMMENDATION:
Screening mammogram in one year. (Code:Q3-W-BC3)

BI-RADS CATEGORY  1: Negative.

## 2022-11-09 ENCOUNTER — Ambulatory Visit
Admission: RE | Admit: 2022-11-09 | Discharge: 2022-11-09 | Disposition: A | Discharge status: Home / Self Care | Payer: Medicare Other | Source: Ambulatory Visit | Attending: Internal Medicine | Admitting: Internal Medicine

## 2022-11-09 DIAGNOSIS — Z1231 Encounter for screening mammogram for malignant neoplasm of breast: Secondary | ICD-10-CM

## 2023-12-19 ENCOUNTER — Other Ambulatory Visit: Payer: Self-pay | Admitting: Internal Medicine

## 2023-12-19 DIAGNOSIS — Z1231 Encounter for screening mammogram for malignant neoplasm of breast: Secondary | ICD-10-CM

## 2023-12-28 ENCOUNTER — Ambulatory Visit
Admission: RE | Admit: 2023-12-28 | Discharge: 2023-12-28 | Disposition: A | Discharge status: Home / Self Care | Source: Ambulatory Visit | Attending: Internal Medicine | Admitting: Internal Medicine

## 2023-12-28 DIAGNOSIS — Z1231 Encounter for screening mammogram for malignant neoplasm of breast: Secondary | ICD-10-CM
